# Patient Record
Sex: Male | Born: 1945 | Race: White | Hispanic: No | Marital: Married | Smoking: Never smoker
Health system: Southern US, Community
[De-identification: ages and names within clinical notes are randomized; demographics above are authoritative.]

## PROBLEM LIST (undated history)

## (undated) DIAGNOSIS — T7840XA Allergy, unspecified, initial encounter: Secondary | ICD-10-CM

## (undated) DIAGNOSIS — M329 Systemic lupus erythematosus, unspecified: Secondary | ICD-10-CM

## (undated) DIAGNOSIS — M810 Age-related osteoporosis without current pathological fracture: Secondary | ICD-10-CM

## (undated) DIAGNOSIS — I10 Essential (primary) hypertension: Secondary | ICD-10-CM

## (undated) HISTORY — DX: Age-related osteoporosis without current pathological fracture: M81.0

## (undated) HISTORY — DX: Essential (primary) hypertension: I10

## (undated) HISTORY — DX: Allergy, unspecified, initial encounter: T78.40XA

## (undated) HISTORY — DX: Systemic lupus erythematosus, unspecified: M32.9

---

## 1983-07-30 HISTORY — PX: APPENDECTOMY: SHX54

## 2001-05-28 HISTORY — PX: KIDNEY STONE SURGERY: SHX686

## 2003-11-29 HISTORY — PX: ESOPHAGOGASTRODUODENOSCOPY ENDOSCOPY: SHX5814

## 2003-11-29 HISTORY — PX: COLONOSCOPY: SHX174

## 2006-02-26 HISTORY — PX: HEMORROIDECTOMY: SUR656

## 2010-10-28 HISTORY — PX: HERNIA REPAIR: SHX51

## 2010-11-05 ENCOUNTER — Ambulatory Visit (HOSPITAL_COMMUNITY)
Admission: RE | Admit: 2010-11-05 | Discharge: 2010-11-05 | Payer: Self-pay | Source: Home / Self Care | Attending: General Surgery | Admitting: General Surgery

## 2011-02-08 LAB — DIFFERENTIAL
Basophils Relative: 1 % (ref 0–1)
Eosinophils Absolute: 0 10*3/uL (ref 0.0–0.7)
Eosinophils Relative: 1 % (ref 0–5)
Lymphs Abs: 1.2 10*3/uL (ref 0.7–4.0)
Monocytes Relative: 11 % (ref 3–12)
Neutrophils Relative %: 49 % (ref 43–77)

## 2011-02-08 LAB — COMPREHENSIVE METABOLIC PANEL
ALT: 17 U/L (ref 0–53)
AST: 27 U/L (ref 0–37)
Alkaline Phosphatase: 63 U/L (ref 39–117)
CO2: 30 mEq/L (ref 19–32)
Calcium: 9.2 mg/dL (ref 8.4–10.5)
GFR calc Af Amer: >60 mL/min (ref >60)
GFR calc non Af Amer: >60 mL/min (ref >60)
Glucose, Bld: 78 mg/dL (ref 70–99)
Potassium: 3.9 mEq/L (ref 3.5–5.1)
Sodium: 141 mEq/L (ref 135–145)

## 2011-02-08 LAB — CBC
HCT: 39.8 % (ref 39.0–52.0)
Hemoglobin: 13.6 g/dL (ref 13.0–17.0)
RBC: 4.15 MIL/uL — ABNORMAL LOW (ref 4.22–5.81)
WBC: 3 10*3/uL — ABNORMAL LOW (ref 4.0–10.5)

## 2011-02-08 LAB — URINALYSIS, ROUTINE W REFLEX MICROSCOPIC
Glucose, UA: NEGATIVE mg/dL
Nitrite: NEGATIVE
Specific Gravity, Urine: 1.014 (ref 1.005–1.030)
pH: 7.5 (ref 5.0–8.0)

## 2011-02-08 LAB — SURGICAL PCR SCREEN: MRSA, PCR: NEGATIVE

## 2011-10-04 DIAGNOSIS — I10 Essential (primary) hypertension: Secondary | ICD-10-CM | POA: Insufficient documentation

## 2011-12-01 IMAGING — CR DG CHEST 2V
3 series · 3 of 3 positions shown · non-contrast
Comparison: None.

CLINICAL DATA: Preop.

CHEST - 2 VIEW

[w chest pa]
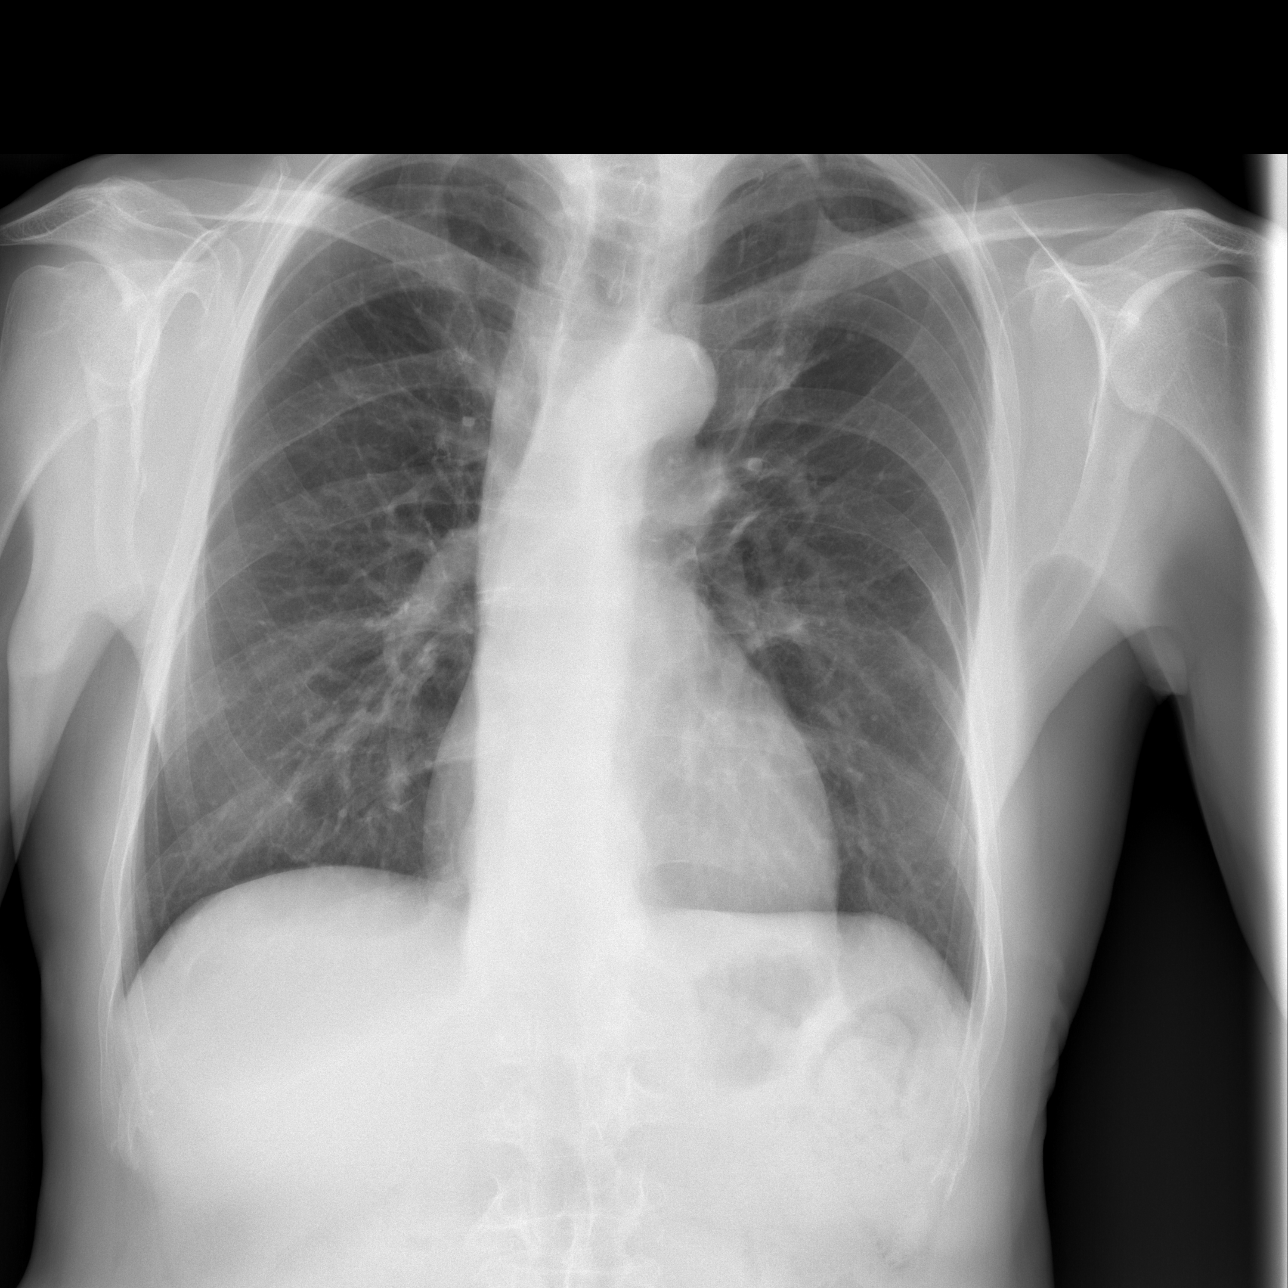

[w chest lat (1 of 2)]
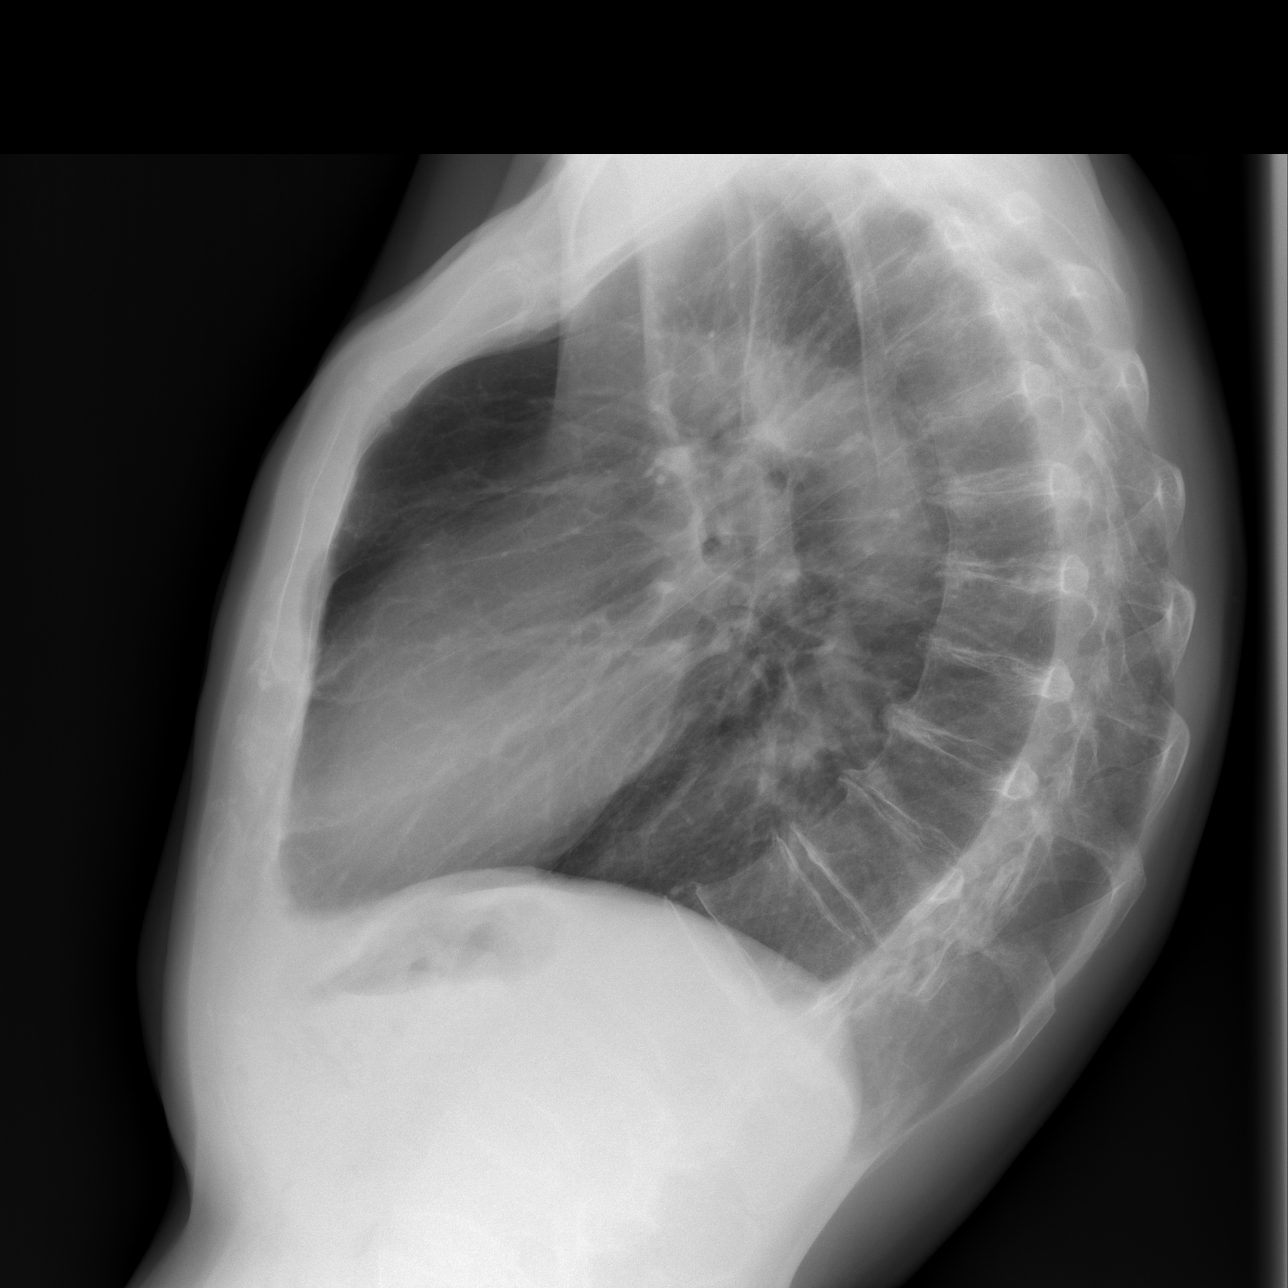

[w chest lat (2 of 2)]
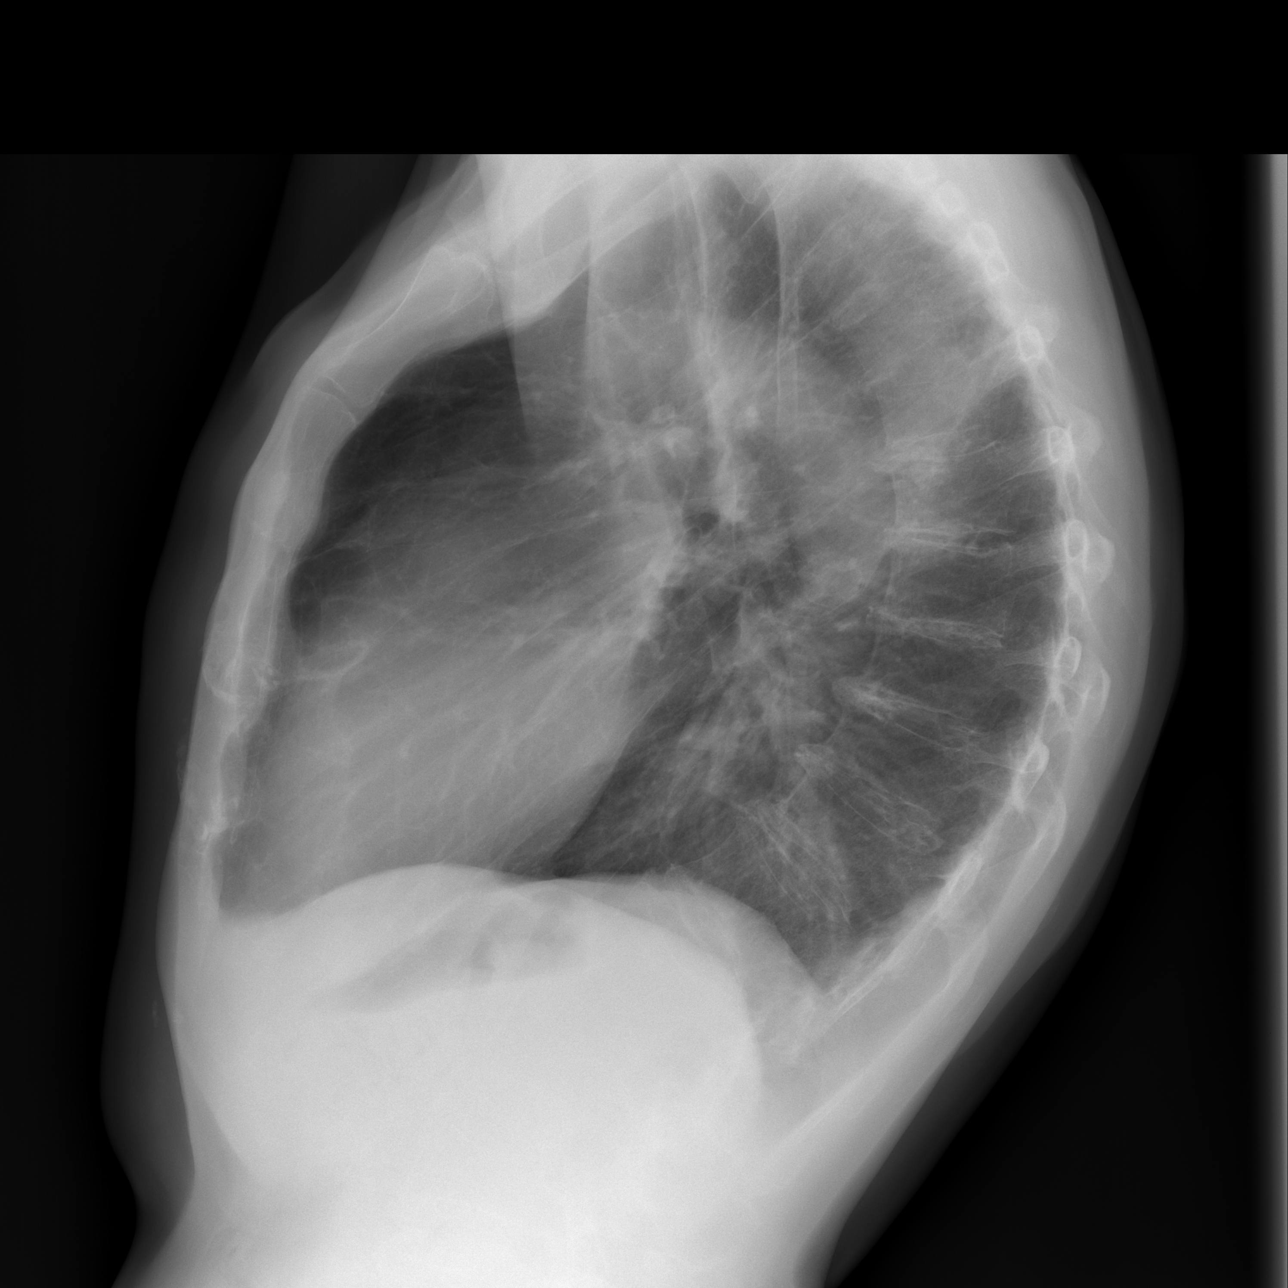

[3 of 3 positions shown; findings below may reference images not displayed]

FINDINGS: Trachea is midline.  Heart size normal.  Mild biapical
pleural parenchymal scarring.  Suspect mild left hilar retraction
with adjacent left suprahilar scarring.  Lungs  are otherwise
clear.  No pleural fluid.  Degenerative changes are seen in the
spine.
IMPRESSION: No acute findings.

## 2014-07-01 ENCOUNTER — Telehealth: Payer: Self-pay | Admitting: Gastroenterology

## 2014-07-04 ENCOUNTER — Encounter: Payer: Self-pay | Admitting: Gastroenterology

## 2014-08-25 ENCOUNTER — Ambulatory Visit (AMBULATORY_SURGERY_CENTER): Payer: Medicare Other | Admitting: *Deleted

## 2014-08-25 VITALS — Ht 70.0 in | Wt 149.8 lb

## 2014-08-25 DIAGNOSIS — Z1211 Encounter for screening for malignant neoplasm of colon: Secondary | ICD-10-CM

## 2014-08-25 MED ORDER — MOVIPREP 100 G PO SOLR: 1.0000 | Freq: Once | ORAL | Status: DC

## 2014-08-25 NOTE — Progress Notes (Signed)
No home 02 use. ewm No egg or soy allergy. ewm No diet pills ewm Pt declined emmi video. ewm Pt denies any issues with previous sedation. ewm Pt has been dx'd with lupus and has lighter patches on his back he wanted [place] to be aware of. ewm

## 2014-08-26 NOTE — Telephone Encounter (Signed)
Scheduled 09-09-14/yf

## 2014-09-09 ENCOUNTER — Encounter: Payer: Self-pay | Admitting: Gastroenterology

## 2014-11-11 ENCOUNTER — Encounter: Payer: Self-pay | Admitting: Gastroenterology

## 2014-11-11 ENCOUNTER — Ambulatory Visit (AMBULATORY_SURGERY_CENTER): Payer: Medicare Other | Admitting: Gastroenterology

## 2014-11-11 VITALS — BP 117/60 | HR 150 | Temp 96.7°F | Resp 34 | Ht 70.0 in | Wt 149.0 lb

## 2014-11-11 DIAGNOSIS — D124 Benign neoplasm of descending colon: Secondary | ICD-10-CM

## 2014-11-11 DIAGNOSIS — Z1211 Encounter for screening for malignant neoplasm of colon: Secondary | ICD-10-CM

## 2014-11-11 MED ORDER — SODIUM CHLORIDE 0.9 % IV SOLN: 500.0000 mL | INTRAVENOUS | Status: DC

## 2014-11-11 NOTE — Op Note (Signed)
[place]  [address] [place], [id number]   COLONOSCOPY PROCEDURE REPORT  PATIENT: Brian, Curtis  MR#: [id number] BIRTHDATE: 07/29/1946 , 72  yrs. old GENDER: male ENDOSCOPIST: Ladene Artist, MD, [facility] REFERRED BY: Azzie Roup, MD PROCEDURE DATE:  11/11/2014 PROCEDURE:   Colonoscopy with snare polypectomy First Screening Colonoscopy - Avg.  risk and is 50 yrs.  old or older - No.  Prior Negative Screening - Now for repeat screening. 10 or more years since last screening  History of Adenoma - Now for follow-up colonoscopy & has been > or = to 3 yrs.  N/A  Polyps Removed Today? Yes. ASA CLASS:   Class II INDICATIONS:average risk for colorectal cancer. MEDICATIONS: Monitored anesthesia care and Propofol 200 mg IV DESCRIPTION OF PROCEDURE:   After the risks benefits and alternatives of the procedure were thoroughly explained, informed consent was obtained.  The digital rectal exam revealed no abnormalities of the rectum.   The LB [id number]  endoscope was introduced through the anus and advanced to the cecum, which was identified by both the appendix and ileocecal valve. No adverse events experienced.   The quality of the prep was adequate, using MoviPrep  The instrument was then slowly withdrawn as the colon was fully examined.  COLON FINDINGS: A sessile polyp measuring 5 mm in size was found in the descending colon.  A polypectomy was performed with a cold snare.  The resection was complete, the polyp tissue was completely retrieved and sent to histology.   There was moderate diverticulosis noted in the left colon with associated luminal narrowing and muscular hypertrophy.   The colonic mucosa appeared normal in the transverse colon, at the splenic flexure, in the rectum, at the cecum, in the ascending colon, and at the hepatic flexure.  Retroflexed views revealed no abnormalities. The time to cecum=3 minutes 06 seconds.  Withdrawal time=8 minutes 50  seconds. The scope was withdrawn and the procedure completed.  COMPLICATIONS: There were no immediate complications.  ENDOSCOPIC IMPRESSION: 1.   Sessile polyp in the descending colon; polypectomy performed with a cold snare 2.   Moderate diverticulosis in the left colon  RECOMMENDATIONS: 1.  Await pathology results 2.  Repeat colonoscopy in 5 years if polyp adenomatous; otherwise 10 years 3.  High fiber diet with liberal fluid intake.  eSigned:  Ladene Artist, MD, [facility] 11/11/2014 11:38 AM

## 2014-11-11 NOTE — Progress Notes (Signed)
  [facility] Post-op Note  Patient: Brian Curtis  Procedure(s) Performed: colonoscopy  Patient Location: [facility] - Recovery Area  Anesthesia Type: Deep Sedation/Propofol  Level of Consciousness: awake, oriented and patient cooperative  Airway and Oxygen Therapy: Patient Spontanous Breathing  Post-op Pain: none  Post-op Assessment:  Post-op Vital signs reviewed, Patient's Cardiovascular Status Stable, Respiratory Function Stable, Patent Airway, No signs of Nausea or vomiting and Pain level controlled  Post-op Vital Signs: Reviewed and stable  Complications: No apparent anesthesia complications  Demetreus Lothamer E 11:40 AM

## 2014-11-11 NOTE — Progress Notes (Signed)
Called to room to assist during endoscopic procedure.  Patient ID and intended procedure confirmed with present staff. Received instructions for my participation in the procedure from the performing physician.  

## 2014-11-11 NOTE — Patient Instructions (Signed)

## 2014-11-12 ENCOUNTER — Telehealth: Payer: Self-pay

## 2014-11-12 NOTE — Telephone Encounter (Signed)
  Follow up Call-  Call back number 11/11/2014  Post procedure Call Back phone  # [phone]  Permission to leave phone message Yes     Patient questions:  Do you have a fever, pain , or abdominal swelling? No. Pain Score  0 *  Have you tolerated food without any problems? Yes.    Have you been able to return to your normal activities? Yes.    Do you have any questions about your discharge instructions: Diet   No. Medications  No. Follow up visit  No.  Do you have questions or concerns about your Care? No.  Actions: * If pain score is 4 or above: No action needed, pain <4.

## 2014-11-21 ENCOUNTER — Encounter: Payer: Self-pay | Admitting: Gastroenterology

## 2015-07-31 DIAGNOSIS — J309 Allergic rhinitis, unspecified: Secondary | ICD-10-CM

## 2015-08-27 ENCOUNTER — Ambulatory Visit (INDEPENDENT_AMBULATORY_CARE_PROVIDER_SITE_OTHER): Payer: Medicare Other | Admitting: *Deleted

## 2015-08-27 DIAGNOSIS — J309 Allergic rhinitis, unspecified: Secondary | ICD-10-CM

## 2015-09-17 ENCOUNTER — Ambulatory Visit (INDEPENDENT_AMBULATORY_CARE_PROVIDER_SITE_OTHER): Payer: Medicare Other | Admitting: *Deleted

## 2015-09-17 DIAGNOSIS — J309 Allergic rhinitis, unspecified: Secondary | ICD-10-CM

## 2015-10-08 ENCOUNTER — Ambulatory Visit (INDEPENDENT_AMBULATORY_CARE_PROVIDER_SITE_OTHER): Payer: Medicare Other | Admitting: *Deleted

## 2015-10-08 DIAGNOSIS — J3089 Other allergic rhinitis: Secondary | ICD-10-CM

## 2015-10-08 DIAGNOSIS — J309 Allergic rhinitis, unspecified: Secondary | ICD-10-CM | POA: Diagnosis not present

## 2015-10-16 DIAGNOSIS — J3089 Other allergic rhinitis: Secondary | ICD-10-CM | POA: Diagnosis not present

## 2015-10-26 ENCOUNTER — Ambulatory Visit (INDEPENDENT_AMBULATORY_CARE_PROVIDER_SITE_OTHER): Payer: Medicare Other

## 2015-10-26 DIAGNOSIS — J309 Allergic rhinitis, unspecified: Secondary | ICD-10-CM

## 2015-11-10 ENCOUNTER — Ambulatory Visit (INDEPENDENT_AMBULATORY_CARE_PROVIDER_SITE_OTHER): Payer: Medicare Other | Admitting: Pediatrics

## 2015-11-10 ENCOUNTER — Encounter: Payer: Self-pay | Admitting: Pediatrics

## 2015-11-10 VITALS — BP 126/66 | HR 56 | Temp 98.1°F | Resp 16 | Ht 68.11 in | Wt 151.0 lb

## 2015-11-10 DIAGNOSIS — J069 Acute upper respiratory infection, unspecified: Secondary | ICD-10-CM | POA: Insufficient documentation

## 2015-11-10 DIAGNOSIS — I10 Essential (primary) hypertension: Secondary | ICD-10-CM | POA: Insufficient documentation

## 2015-11-10 DIAGNOSIS — J3089 Other allergic rhinitis: Secondary | ICD-10-CM | POA: Diagnosis not present

## 2015-11-10 NOTE — Patient Instructions (Signed)
Continue on the treatment plan outlined above Nasal saline irrigations twice a day for about a week If he develops a sinus infection he will use amoxicillin 500 mg 3 times a day for 10 days He will not get another allergy injection until next week

## 2015-11-10 NOTE — Progress Notes (Signed)
  [place] [address] Dept: [phone]  FOLLOW UP NOTE  Patient ID: Brian Curtis, male    DOB: 08/09/1946  Age: 69 y.o. MRN: [id number] Date of Office Visit: 11/10/2015  Assessment Chief Complaint: Nasal Congestion  HPI Brian Curtis presents for evaluation of nasal congestion for 3 days. He has had a slight sore throat. He is on allergy injections every 3 weeks.  Current medications are lisinopril 10 mg once a day. His other medications are outlined in the chart   Drug Allergies:  No Known Allergies  Physical Exam: BP 126/66 mmHg  Pulse 56  Temp(Src) 98.1 F (36.7 C) (Oral)  Resp 16  Ht 5' 8.11" (1.73 m)  Wt 151 lb 0.2 oz (68.5 kg)  BMI 22.89 kg/m2   Physical Exam  Constitutional: He is oriented to person, place, and time. He appears well-developed and well-nourished.  HENT:  Eyes normal. Ears normal. Nose mild swelling of nasal turbinates. Pharynx normal.  Neck: Neck supple.  Cardiovascular:  S1 and S2 normal no murmurs  Pulmonary/Chest:  Clear to percussion and auscultation  Lymphadenopathy:    He has no cervical adenopathy.  Neurological: He is alert and oriented to person, place, and time.  Psychiatric: He has a normal mood and affect. His behavior is normal. Judgment and thought content normal.  Vitals reviewed.   Diagnostics:  none  Assessment and Plan: 1. Other allergic rhinitis   2. Viral upper respiratory tract infection   3. Essential hypertension         Patient Instructions  Continue on the treatment plan outlined above Nasal saline irrigations twice a day for about a week If he develops a sinus infection he will use amoxicillin 500 mg 3 times a day for 10 days He will not get another allergy injection until next week    No Follow-up on file.    Thank you for the opportunity to care for this patient.  Please do not hesitate to contact me with questions.  Penne Lash, M.D.  [facility] [address] [place], [place] [address] [phone]

## 2015-11-19 ENCOUNTER — Ambulatory Visit (INDEPENDENT_AMBULATORY_CARE_PROVIDER_SITE_OTHER): Payer: Medicare Other | Admitting: *Deleted

## 2015-11-19 DIAGNOSIS — J309 Allergic rhinitis, unspecified: Secondary | ICD-10-CM | POA: Diagnosis not present

## 2015-11-25 ENCOUNTER — Ambulatory Visit (INDEPENDENT_AMBULATORY_CARE_PROVIDER_SITE_OTHER): Payer: Medicare Other

## 2015-11-25 DIAGNOSIS — J309 Allergic rhinitis, unspecified: Secondary | ICD-10-CM | POA: Diagnosis not present

## 2015-12-02 ENCOUNTER — Ambulatory Visit (INDEPENDENT_AMBULATORY_CARE_PROVIDER_SITE_OTHER): Payer: Medicare Other

## 2015-12-02 DIAGNOSIS — J309 Allergic rhinitis, unspecified: Secondary | ICD-10-CM

## 2015-12-10 ENCOUNTER — Ambulatory Visit (INDEPENDENT_AMBULATORY_CARE_PROVIDER_SITE_OTHER): Payer: Medicare Other | Admitting: *Deleted

## 2015-12-10 DIAGNOSIS — J309 Allergic rhinitis, unspecified: Secondary | ICD-10-CM

## 2015-12-17 ENCOUNTER — Ambulatory Visit (INDEPENDENT_AMBULATORY_CARE_PROVIDER_SITE_OTHER): Payer: Medicare Other | Admitting: *Deleted

## 2015-12-17 ENCOUNTER — Encounter: Payer: Self-pay | Admitting: Pediatrics

## 2015-12-17 ENCOUNTER — Ambulatory Visit (INDEPENDENT_AMBULATORY_CARE_PROVIDER_SITE_OTHER): Payer: Medicare Other | Admitting: Pediatrics

## 2015-12-17 VITALS — BP 130/68 | HR 76 | Temp 97.6°F | Resp 16 | Ht 68.3 in | Wt 150.4 lb

## 2015-12-17 DIAGNOSIS — K5790 Diverticulosis of intestine, part unspecified, without perforation or abscess without bleeding: Secondary | ICD-10-CM | POA: Insufficient documentation

## 2015-12-17 DIAGNOSIS — H1045 Other chronic allergic conjunctivitis: Secondary | ICD-10-CM | POA: Diagnosis not present

## 2015-12-17 DIAGNOSIS — I10 Essential (primary) hypertension: Secondary | ICD-10-CM

## 2015-12-17 DIAGNOSIS — H1010 Acute atopic conjunctivitis, unspecified eye: Secondary | ICD-10-CM

## 2015-12-17 DIAGNOSIS — J309 Allergic rhinitis, unspecified: Secondary | ICD-10-CM

## 2015-12-17 DIAGNOSIS — N200 Calculus of kidney: Secondary | ICD-10-CM | POA: Insufficient documentation

## 2015-12-17 NOTE — Progress Notes (Signed)
  [place] [address] Dept: [phone]  FOLLOW UP NOTE  Patient ID: Brian Curtis, male    DOB: [birth year]  Age: 70 y.o. MRN: [id number] Date of Office Visit: 12/17/2015  Assessment Chief Complaint: Follow-up  HPI DEVANTA JUBY presents for follow-up of allergic rhinitis. He is symptoms are dramatically improved taking allergy injections every 3 weeks. He would like to continue on allergy injections every 3 weeks because it keeps him from having sinus infections.  Current medications are allergy injections. His other medications are outlined in the chart    Drug Allergies:  No Known Allergies  Physical Exam: BP 130/68 mmHg  Pulse 76  Temp(Src) 97.6 F (36.4 C) (Oral)  Resp 16  Ht 5' 8.3" (1.735 m)  Wt 150 lb 6.4 oz (68.221 kg)  BMI 22.66 kg/m2   Physical Exam  Constitutional: He is oriented to person, place, and time. He appears well-developed and well-nourished.  HENT:  Eyes normal. Ears normal. Nose normal. Pharynx normal.  Neck: Neck supple.  Cardiovascular:  S1 and S2 normal no murmurs  Pulmonary/Chest:  Clear to percussion auscultation  Lymphadenopathy:    He has no cervical adenopathy.  Neurological: He is alert and oriented to person, place, and time.  Psychiatric: He has a normal mood and affect. His behavior is normal. Judgment and thought content normal.  Vitals reviewed.   Diagnostics:   none Assessment and Plan: 1. Essential hypertension   2. Seasonal allergic conjunctivitis         Patient Instructions  Continue on the treatment plan outlined above with allergy injections every 3 weeks Loratadine 10 mg once a day if needed for runny nose or itchy eyes    Return in about 1 year (around 12/16/2016).    Thank you for the opportunity to care for this patient.  Please do not hesitate to contact me with questions.  Penne Lash, M.D.  [facility] [address] [place], [place]  [address] [phone]

## 2015-12-17 NOTE — Patient Instructions (Addendum)
Continue on the treatment plan outlined above with allergy injections every 3 weeks Loratadine 10 mg once a day if needed for runny nose or itchy eyes

## 2016-01-05 ENCOUNTER — Ambulatory Visit (INDEPENDENT_AMBULATORY_CARE_PROVIDER_SITE_OTHER): Payer: Medicare Other

## 2016-01-05 DIAGNOSIS — J309 Allergic rhinitis, unspecified: Secondary | ICD-10-CM

## 2016-01-21 DIAGNOSIS — J3089 Other allergic rhinitis: Secondary | ICD-10-CM

## 2016-01-25 ENCOUNTER — Ambulatory Visit (INDEPENDENT_AMBULATORY_CARE_PROVIDER_SITE_OTHER): Payer: Medicare Other

## 2016-01-25 DIAGNOSIS — J309 Allergic rhinitis, unspecified: Secondary | ICD-10-CM | POA: Diagnosis not present

## 2016-02-16 ENCOUNTER — Ambulatory Visit (INDEPENDENT_AMBULATORY_CARE_PROVIDER_SITE_OTHER): Payer: Medicare Other

## 2016-02-16 DIAGNOSIS — J309 Allergic rhinitis, unspecified: Secondary | ICD-10-CM

## 2016-03-07 ENCOUNTER — Ambulatory Visit (INDEPENDENT_AMBULATORY_CARE_PROVIDER_SITE_OTHER): Payer: Medicare Other

## 2016-03-07 DIAGNOSIS — J309 Allergic rhinitis, unspecified: Secondary | ICD-10-CM | POA: Diagnosis not present

## 2016-03-30 ENCOUNTER — Ambulatory Visit (INDEPENDENT_AMBULATORY_CARE_PROVIDER_SITE_OTHER): Payer: Medicare Other | Admitting: *Deleted

## 2016-03-30 DIAGNOSIS — J309 Allergic rhinitis, unspecified: Secondary | ICD-10-CM

## 2016-04-11 ENCOUNTER — Ambulatory Visit (INDEPENDENT_AMBULATORY_CARE_PROVIDER_SITE_OTHER): Payer: Medicare Other

## 2016-04-11 DIAGNOSIS — J309 Allergic rhinitis, unspecified: Secondary | ICD-10-CM | POA: Diagnosis not present

## 2016-05-04 ENCOUNTER — Ambulatory Visit (INDEPENDENT_AMBULATORY_CARE_PROVIDER_SITE_OTHER): Payer: Medicare Other

## 2016-05-04 DIAGNOSIS — J309 Allergic rhinitis, unspecified: Secondary | ICD-10-CM

## 2016-05-10 ENCOUNTER — Ambulatory Visit (INDEPENDENT_AMBULATORY_CARE_PROVIDER_SITE_OTHER): Payer: Medicare Other | Admitting: *Deleted

## 2016-05-10 DIAGNOSIS — J309 Allergic rhinitis, unspecified: Secondary | ICD-10-CM

## 2016-05-16 ENCOUNTER — Ambulatory Visit (INDEPENDENT_AMBULATORY_CARE_PROVIDER_SITE_OTHER): Payer: Medicare Other

## 2016-05-16 DIAGNOSIS — J309 Allergic rhinitis, unspecified: Secondary | ICD-10-CM | POA: Diagnosis not present

## 2016-05-23 ENCOUNTER — Ambulatory Visit (INDEPENDENT_AMBULATORY_CARE_PROVIDER_SITE_OTHER): Payer: Medicare Other

## 2016-05-23 DIAGNOSIS — J309 Allergic rhinitis, unspecified: Secondary | ICD-10-CM

## 2016-06-01 ENCOUNTER — Ambulatory Visit (INDEPENDENT_AMBULATORY_CARE_PROVIDER_SITE_OTHER): Payer: Medicare Other

## 2016-06-01 DIAGNOSIS — J309 Allergic rhinitis, unspecified: Secondary | ICD-10-CM | POA: Diagnosis not present

## 2016-06-20 ENCOUNTER — Ambulatory Visit (INDEPENDENT_AMBULATORY_CARE_PROVIDER_SITE_OTHER): Payer: Medicare Other

## 2016-06-20 DIAGNOSIS — J309 Allergic rhinitis, unspecified: Secondary | ICD-10-CM

## 2016-07-07 ENCOUNTER — Ambulatory Visit (INDEPENDENT_AMBULATORY_CARE_PROVIDER_SITE_OTHER): Payer: Medicare Other

## 2016-07-07 DIAGNOSIS — J309 Allergic rhinitis, unspecified: Secondary | ICD-10-CM | POA: Diagnosis not present

## 2016-07-28 ENCOUNTER — Ambulatory Visit (INDEPENDENT_AMBULATORY_CARE_PROVIDER_SITE_OTHER): Payer: Medicare Other

## 2016-07-28 DIAGNOSIS — J309 Allergic rhinitis, unspecified: Secondary | ICD-10-CM

## 2016-07-29 ENCOUNTER — Encounter: Payer: Self-pay | Admitting: *Deleted

## 2016-07-29 DIAGNOSIS — J3089 Other allergic rhinitis: Secondary | ICD-10-CM | POA: Diagnosis not present

## 2016-08-16 ENCOUNTER — Ambulatory Visit (INDEPENDENT_AMBULATORY_CARE_PROVIDER_SITE_OTHER): Payer: Medicare Other

## 2016-08-16 DIAGNOSIS — J309 Allergic rhinitis, unspecified: Secondary | ICD-10-CM | POA: Diagnosis not present

## 2016-09-05 ENCOUNTER — Ambulatory Visit (INDEPENDENT_AMBULATORY_CARE_PROVIDER_SITE_OTHER): Payer: Medicare Other

## 2016-09-05 DIAGNOSIS — J309 Allergic rhinitis, unspecified: Secondary | ICD-10-CM | POA: Diagnosis not present

## 2016-09-27 ENCOUNTER — Ambulatory Visit (INDEPENDENT_AMBULATORY_CARE_PROVIDER_SITE_OTHER): Payer: Medicare Other

## 2016-09-27 DIAGNOSIS — J309 Allergic rhinitis, unspecified: Secondary | ICD-10-CM

## 2016-10-11 ENCOUNTER — Ambulatory Visit (INDEPENDENT_AMBULATORY_CARE_PROVIDER_SITE_OTHER): Payer: Medicare Other

## 2016-10-11 DIAGNOSIS — J309 Allergic rhinitis, unspecified: Secondary | ICD-10-CM

## 2016-10-25 ENCOUNTER — Ambulatory Visit (INDEPENDENT_AMBULATORY_CARE_PROVIDER_SITE_OTHER): Payer: Medicare Other

## 2016-10-25 DIAGNOSIS — J309 Allergic rhinitis, unspecified: Secondary | ICD-10-CM | POA: Diagnosis not present

## 2016-11-07 ENCOUNTER — Ambulatory Visit (INDEPENDENT_AMBULATORY_CARE_PROVIDER_SITE_OTHER): Payer: Medicare Other

## 2016-11-07 DIAGNOSIS — J309 Allergic rhinitis, unspecified: Secondary | ICD-10-CM

## 2016-11-17 ENCOUNTER — Ambulatory Visit (INDEPENDENT_AMBULATORY_CARE_PROVIDER_SITE_OTHER): Payer: Medicare Other

## 2016-11-17 DIAGNOSIS — J309 Allergic rhinitis, unspecified: Secondary | ICD-10-CM

## 2016-12-06 ENCOUNTER — Ambulatory Visit (INDEPENDENT_AMBULATORY_CARE_PROVIDER_SITE_OTHER): Payer: Medicare Other

## 2016-12-06 DIAGNOSIS — J309 Allergic rhinitis, unspecified: Secondary | ICD-10-CM | POA: Diagnosis not present

## 2016-12-20 NOTE — Addendum Note (Signed)
Addended by: Felipa Emory on: 12/20/2016 03:10 PM   Modules accepted: Orders

## 2016-12-28 ENCOUNTER — Ambulatory Visit (INDEPENDENT_AMBULATORY_CARE_PROVIDER_SITE_OTHER): Payer: Medicare Other | Admitting: *Deleted

## 2016-12-28 DIAGNOSIS — J309 Allergic rhinitis, unspecified: Secondary | ICD-10-CM | POA: Diagnosis not present

## 2016-12-30 ENCOUNTER — Encounter: Payer: Self-pay | Admitting: *Deleted

## 2016-12-30 NOTE — Progress Notes (Signed)
1 maintenance vial made. Exp: 12/30/17. hc

## 2017-01-03 ENCOUNTER — Ambulatory Visit (INDEPENDENT_AMBULATORY_CARE_PROVIDER_SITE_OTHER): Payer: Medicare Other | Admitting: Pediatrics

## 2017-01-03 ENCOUNTER — Encounter: Payer: Self-pay | Admitting: Pediatrics

## 2017-01-03 VITALS — BP 114/64 | HR 60 | Temp 97.5°F | Resp 16 | Ht 68.0 in | Wt 150.0 lb

## 2017-01-03 DIAGNOSIS — I10 Essential (primary) hypertension: Secondary | ICD-10-CM

## 2017-01-03 DIAGNOSIS — J301 Allergic rhinitis due to pollen: Secondary | ICD-10-CM | POA: Diagnosis not present

## 2017-01-03 NOTE — Patient Instructions (Addendum)
Continue on allergy injections every 3 weeks Call me if he is not doing well on this treatment plan Loratadine 10 mg once a day if needed for runny nose

## 2017-01-03 NOTE — Progress Notes (Signed)
  [place] [address] Dept: [phone]  FOLLOW UP NOTE  Patient ID: Brian Curtis, male    DOB: 1946-07-25  Age: 71 y.o. MRN: [id number] Date of Office Visit: 01/03/2017  Assessment  Chief Complaint: Allergies  HPI Brian Curtis presents for follow-up of allergic rhinitis. His allergic rhinitis is well controlled on getting his allergy injections every 3 weeks. He would like to continue on allergy injections. He does nasal saline irrigations once a day and that is all he needs for his allergic rhinitis, in addition to the injections He has not had a sinus infection in the past year.  Current medications are outlined in the chart   Drug Allergies:  No Known Allergies  Physical Exam: BP 114/64   Pulse 60   Temp 97.5 F (36.4 C) (Oral)   Resp 16   Ht 5\' 8"  (1.727 m)   Wt 150 lb (68 kg)   BMI 22.81 kg/m    Physical Exam  Constitutional: He is oriented to person, place, and time. He appears well-developed and well-nourished.  HENT:  Eyes normal. Ears normal. Nose normal. Pharynx normal.  Neck: Neck supple.  Cardiovascular:  S1  S2 normal no murmurs  Pulmonary/Chest:  Clear to percussion and auscultation  Lymphadenopathy:    He has no cervical adenopathy.  Neurological: He is alert and oriented to person, place, and time.  Psychiatric: He has a normal mood and affect. His behavior is normal. Judgment and thought content normal.  Vitals reviewed.   Diagnostics:  none  Assessment and Plan: 1. Acute seasonal allergic rhinitis due to pollen   2. Essential hypertension      Patient Instructions  Continue on allergy injections every 3 weeks Call me if he is not doing well on this treatment plan Loratadine 10 mg once a day if needed for runny nose   Return in about 1 year (around 01/03/2018).    Thank you for the opportunity to care for this patient.  Please do not hesitate to contact me with questions.  Penne Lash, M.D.  [facility] [address] [place], [place] [address] [phone]

## 2017-01-17 ENCOUNTER — Ambulatory Visit (INDEPENDENT_AMBULATORY_CARE_PROVIDER_SITE_OTHER): Payer: Medicare Other

## 2017-01-17 DIAGNOSIS — J309 Allergic rhinitis, unspecified: Secondary | ICD-10-CM

## 2017-01-24 DIAGNOSIS — J3089 Other allergic rhinitis: Secondary | ICD-10-CM

## 2017-02-08 ENCOUNTER — Ambulatory Visit (INDEPENDENT_AMBULATORY_CARE_PROVIDER_SITE_OTHER): Payer: Medicare Other

## 2017-02-08 DIAGNOSIS — J309 Allergic rhinitis, unspecified: Secondary | ICD-10-CM

## 2017-03-02 ENCOUNTER — Ambulatory Visit (INDEPENDENT_AMBULATORY_CARE_PROVIDER_SITE_OTHER): Payer: Medicare Other

## 2017-03-02 DIAGNOSIS — J309 Allergic rhinitis, unspecified: Secondary | ICD-10-CM | POA: Diagnosis not present

## 2017-03-23 ENCOUNTER — Ambulatory Visit (INDEPENDENT_AMBULATORY_CARE_PROVIDER_SITE_OTHER): Payer: Medicare Other | Admitting: *Deleted

## 2017-03-23 DIAGNOSIS — J309 Allergic rhinitis, unspecified: Secondary | ICD-10-CM

## 2017-03-30 ENCOUNTER — Ambulatory Visit (INDEPENDENT_AMBULATORY_CARE_PROVIDER_SITE_OTHER): Payer: Medicare Other

## 2017-03-30 DIAGNOSIS — J309 Allergic rhinitis, unspecified: Secondary | ICD-10-CM | POA: Diagnosis not present

## 2017-04-06 ENCOUNTER — Ambulatory Visit (INDEPENDENT_AMBULATORY_CARE_PROVIDER_SITE_OTHER): Payer: Medicare Other

## 2017-04-06 DIAGNOSIS — J309 Allergic rhinitis, unspecified: Secondary | ICD-10-CM | POA: Diagnosis not present

## 2017-04-13 ENCOUNTER — Ambulatory Visit (INDEPENDENT_AMBULATORY_CARE_PROVIDER_SITE_OTHER): Payer: Medicare Other | Admitting: *Deleted

## 2017-04-13 DIAGNOSIS — J309 Allergic rhinitis, unspecified: Secondary | ICD-10-CM

## 2017-04-18 ENCOUNTER — Ambulatory Visit (INDEPENDENT_AMBULATORY_CARE_PROVIDER_SITE_OTHER): Payer: Medicare Other | Admitting: *Deleted

## 2017-04-18 DIAGNOSIS — J309 Allergic rhinitis, unspecified: Secondary | ICD-10-CM

## 2017-05-09 ENCOUNTER — Ambulatory Visit (INDEPENDENT_AMBULATORY_CARE_PROVIDER_SITE_OTHER): Payer: Medicare Other

## 2017-05-09 DIAGNOSIS — J309 Allergic rhinitis, unspecified: Secondary | ICD-10-CM

## 2017-05-25 ENCOUNTER — Ambulatory Visit (INDEPENDENT_AMBULATORY_CARE_PROVIDER_SITE_OTHER): Payer: Medicare Other | Admitting: *Deleted

## 2017-05-25 DIAGNOSIS — J309 Allergic rhinitis, unspecified: Secondary | ICD-10-CM | POA: Diagnosis not present

## 2017-06-02 DIAGNOSIS — J301 Allergic rhinitis due to pollen: Secondary | ICD-10-CM

## 2017-06-13 ENCOUNTER — Ambulatory Visit (INDEPENDENT_AMBULATORY_CARE_PROVIDER_SITE_OTHER): Payer: Medicare Other

## 2017-06-13 DIAGNOSIS — J309 Allergic rhinitis, unspecified: Secondary | ICD-10-CM | POA: Diagnosis not present

## 2017-06-28 ENCOUNTER — Ambulatory Visit (INDEPENDENT_AMBULATORY_CARE_PROVIDER_SITE_OTHER): Payer: Medicare Other | Admitting: *Deleted

## 2017-06-28 DIAGNOSIS — J309 Allergic rhinitis, unspecified: Secondary | ICD-10-CM

## 2017-07-04 ENCOUNTER — Ambulatory Visit (INDEPENDENT_AMBULATORY_CARE_PROVIDER_SITE_OTHER): Payer: Medicare Other | Admitting: Physician Assistant

## 2017-07-13 ENCOUNTER — Ambulatory Visit (INDEPENDENT_AMBULATORY_CARE_PROVIDER_SITE_OTHER): Payer: Medicare Other | Admitting: Physician Assistant

## 2017-07-20 ENCOUNTER — Ambulatory Visit (INDEPENDENT_AMBULATORY_CARE_PROVIDER_SITE_OTHER): Payer: Medicare Other

## 2017-07-20 DIAGNOSIS — J309 Allergic rhinitis, unspecified: Secondary | ICD-10-CM | POA: Diagnosis not present

## 2017-08-07 ENCOUNTER — Ambulatory Visit (INDEPENDENT_AMBULATORY_CARE_PROVIDER_SITE_OTHER): Payer: Medicare Other

## 2017-08-07 DIAGNOSIS — J309 Allergic rhinitis, unspecified: Secondary | ICD-10-CM

## 2017-08-10 ENCOUNTER — Ambulatory Visit (INDEPENDENT_AMBULATORY_CARE_PROVIDER_SITE_OTHER): Payer: Medicare Other | Admitting: Orthopaedic Surgery

## 2017-08-30 ENCOUNTER — Ambulatory Visit (INDEPENDENT_AMBULATORY_CARE_PROVIDER_SITE_OTHER): Payer: Medicare Other

## 2017-08-30 DIAGNOSIS — J309 Allergic rhinitis, unspecified: Secondary | ICD-10-CM

## 2017-09-04 ENCOUNTER — Ambulatory Visit (INDEPENDENT_AMBULATORY_CARE_PROVIDER_SITE_OTHER): Payer: Medicare Other

## 2017-09-04 ENCOUNTER — Ambulatory Visit (INDEPENDENT_AMBULATORY_CARE_PROVIDER_SITE_OTHER): Payer: Medicare Other | Admitting: Orthopaedic Surgery

## 2017-09-04 DIAGNOSIS — M25552 Pain in left hip: Secondary | ICD-10-CM

## 2017-09-04 DIAGNOSIS — J309 Allergic rhinitis, unspecified: Secondary | ICD-10-CM | POA: Diagnosis not present

## 2017-09-04 DIAGNOSIS — M25562 Pain in left knee: Secondary | ICD-10-CM

## 2017-09-04 DIAGNOSIS — G8929 Other chronic pain: Secondary | ICD-10-CM | POA: Diagnosis not present

## 2017-09-04 DIAGNOSIS — M1612 Unilateral primary osteoarthritis, left hip: Secondary | ICD-10-CM

## 2017-09-04 NOTE — Progress Notes (Signed)
Office Visit Note   Patient: Brian Curtis           Date of Birth: 1946/01/22           MRN: [id number] Visit Date: 09/04/2017              Requested by: Pollyann Samples, [place], [place] [address] PCP: Elby Beck, MD   Assessment & Plan: Visit Diagnoses:  1. Pain in left hip   2. Chronic pain of left knee   3. Unilateral primary osteoarthritis, left hip     Plan: I showed him the x-rays of his left hip and left knee. I feel that he would benefit from a total hip arthroplasty in the future when he gets the point where it is detrimentally affecting his activities daily living, his quality of life, and his mobility. I gave him a handout on hip replacement surgery and we discussed the risk and benefits of this and what this would involve in detail. He is not ready for my clinical standpoint terms of pain. His x-rays show that radiographically he is ready for a hip Replacement but until this definitely affects him clinically, he would just follow a clinical nonoperative course. I suggested anti-inflammatories and glucosamine as needed. All questions concerns were answered and addressed. He'll follow-up otherwise as needed.  Follow-Up Instructions: Return if symptoms worsen or fail to improve.   Orders:  Orders Placed This Encounter  Procedures  . XR HIP UNILAT W OR W/O PELVIS 1V LEFT  . XR Knee 1-2 Views Left   No orders of the defined types were placed in this encounter.     Procedures: No procedures performed   Clinical Data: No additional findings.   Subjective: No chief complaint on file. Patient is very pleasant 71 year old I'm seeing for the first time as a patient. I have performed a hip replacement on his wife before though. He does come in reporting left hip pain and left knee pain is been going on for some time now. He said is more of an annoyance and inconvenience than severe pain. He does like to know about [place] with  pushing and walking as well as riding and if he's been doing that all day long and he does get pain in that hip after rest. He denies any specific injuries. He does report some back pain.  HPI  Review of Systems He currently denies any headache, chest pain, shortness of breath, fever, chills, nausea, vomiting.  Objective: Vital Signs: There were no vitals taken for this visit.  Physical Exam He is alert and oriented 3 and in no acute distress Ortho Exam Examination of his left knee shows some slight patellofemoral crepitation and slight laterally tracking of the patella. There is no knee joint effusion. His knee is ligamentously stable. There is no significant joint line tenderness. Examination of his left hip show significant limitations in internal rotation rotation. I can actually feel a grinding of the femoral head on the acetabulum. This does cause pain in the groin with rotation. Specialty Comments:  No specialty comments available.  Imaging: Xr Hip Unilat W Or W/o Pelvis 1v Left  Result Date: 09/04/2017 An AP pelvis and lateral of his left hip show severe end-stage arthritis. There is flattening of the femoral head. There is significant sclerotic changes and joint space narrowing as well as para-articular osteophytes.  Xr Knee 1-2 Views Left  Result Date: 09/04/2017 2 views  the left knee show no acute findings. There is minimal lateral joint space narrowing. The overall I'm is well-maintained.    PMFS History: Patient Active Problem List   Diagnosis Date Noted  . Unilateral primary osteoarthritis, left hip 09/04/2017  . DD (diverticular disease) 12/17/2015  . Calculus of kidney 12/17/2015  . Seasonal allergic conjunctivitis 12/17/2015  . Viral upper respiratory tract infection 11/10/2015  . Other allergic rhinitis 11/10/2015  . Essential hypertension 11/10/2015  . Allergic rhinitis 07/31/2015  . BP (high blood pressure) 10/04/2011   Past Medical History:  Diagnosis  Date  . Allergy    allergy shots every 3 weeks  . Hypertension   . Lupus   . Osteoporosis    debateable per pt.     Family History  Problem Relation Age of Onset  . Colon cancer Father   . Stroke Father     Past Surgical History:  Procedure Laterality Date  . APPENDECTOMY  07/1983  . COLONOSCOPY  11/2003  . ESOPHAGOGASTRODUODENOSCOPY ENDOSCOPY  11/2003  . HEMORROIDECTOMY  02/2006   with blood clot  . HERNIA REPAIR  10/2010   "double" per pt.  Marland Kitchen KIDNEY STONE SURGERY  05/2001   Social History   Occupational History  . Not on file.   Social History Main Topics  . Smoking status: Never Smoker  . Smokeless tobacco: Never Used  . Alcohol use 0.6 oz/week    1 Glasses of wine per week     Comment: rare socail  . Drug use: No  . Sexual activity: Yes

## 2017-09-11 ENCOUNTER — Ambulatory Visit (INDEPENDENT_AMBULATORY_CARE_PROVIDER_SITE_OTHER): Payer: Medicare Other

## 2017-09-11 DIAGNOSIS — J309 Allergic rhinitis, unspecified: Secondary | ICD-10-CM | POA: Diagnosis not present

## 2017-09-27 ENCOUNTER — Ambulatory Visit (INDEPENDENT_AMBULATORY_CARE_PROVIDER_SITE_OTHER): Payer: Medicare Other

## 2017-09-27 DIAGNOSIS — J309 Allergic rhinitis, unspecified: Secondary | ICD-10-CM

## 2017-10-13 ENCOUNTER — Ambulatory Visit (INDEPENDENT_AMBULATORY_CARE_PROVIDER_SITE_OTHER): Payer: Medicare Other | Admitting: *Deleted

## 2017-10-13 DIAGNOSIS — J309 Allergic rhinitis, unspecified: Secondary | ICD-10-CM | POA: Diagnosis not present

## 2017-11-01 ENCOUNTER — Ambulatory Visit (INDEPENDENT_AMBULATORY_CARE_PROVIDER_SITE_OTHER): Payer: Medicare Other

## 2017-11-01 DIAGNOSIS — J309 Allergic rhinitis, unspecified: Secondary | ICD-10-CM | POA: Diagnosis not present

## 2017-11-15 ENCOUNTER — Ambulatory Visit (INDEPENDENT_AMBULATORY_CARE_PROVIDER_SITE_OTHER): Payer: Medicare Other | Admitting: *Deleted

## 2017-11-15 DIAGNOSIS — J309 Allergic rhinitis, unspecified: Secondary | ICD-10-CM

## 2017-12-04 ENCOUNTER — Ambulatory Visit (INDEPENDENT_AMBULATORY_CARE_PROVIDER_SITE_OTHER): Payer: Medicare Other

## 2017-12-04 DIAGNOSIS — J309 Allergic rhinitis, unspecified: Secondary | ICD-10-CM

## 2017-12-11 ENCOUNTER — Ambulatory Visit (INDEPENDENT_AMBULATORY_CARE_PROVIDER_SITE_OTHER): Payer: Medicare Other | Admitting: Orthopaedic Surgery

## 2017-12-12 NOTE — Progress Notes (Signed)
VIAL EXP 12-13-18

## 2017-12-13 DIAGNOSIS — J301 Allergic rhinitis due to pollen: Secondary | ICD-10-CM | POA: Diagnosis not present

## 2017-12-19 ENCOUNTER — Encounter (INDEPENDENT_AMBULATORY_CARE_PROVIDER_SITE_OTHER): Payer: Self-pay | Admitting: Orthopaedic Surgery

## 2017-12-19 ENCOUNTER — Ambulatory Visit (INDEPENDENT_AMBULATORY_CARE_PROVIDER_SITE_OTHER): Payer: Medicare Other | Admitting: Orthopaedic Surgery

## 2017-12-19 DIAGNOSIS — M1612 Unilateral primary osteoarthritis, left hip: Secondary | ICD-10-CM | POA: Diagnosis not present

## 2017-12-19 NOTE — Progress Notes (Signed)
Brian Curtis is a 72 year old male known to Dr. Ninfa Linden service comes in today with questions prior to his left total hip arthroplasty 01/05/2018.  He states he just has some questions of IV answered prior to surgery.  He states his pain has become worse in his left hip since he was seen here approximately 3 months ago.  He has had no new injury to the hip.  Remains on lisinopril for hypertension.  No fevers chills shortness of breath chest pain.  Review of systems: See HPI otherwise negative  Physical exam: General well-developed well-nourished male in no acute distress.  He is able to ambulate and get on and off the exam table on his own.  He does not use any assistive devices to ambulate.  Impression: Severe left hip end-stage arthritis  Plan: Questions were answered at length today by Dr. Ninfa Linden myself.  We will have him follow-up with [place] 2 weeks postop.

## 2017-12-20 ENCOUNTER — Other Ambulatory Visit (INDEPENDENT_AMBULATORY_CARE_PROVIDER_SITE_OTHER): Payer: Self-pay

## 2017-12-25 ENCOUNTER — Ambulatory Visit (INDEPENDENT_AMBULATORY_CARE_PROVIDER_SITE_OTHER): Payer: Medicare Other

## 2017-12-25 ENCOUNTER — Other Ambulatory Visit (INDEPENDENT_AMBULATORY_CARE_PROVIDER_SITE_OTHER): Payer: Self-pay | Admitting: Physician Assistant

## 2017-12-25 DIAGNOSIS — J309 Allergic rhinitis, unspecified: Secondary | ICD-10-CM

## 2017-12-28 ENCOUNTER — Other Ambulatory Visit (HOSPITAL_COMMUNITY): Payer: Self-pay | Admitting: Emergency Medicine

## 2017-12-28 NOTE — Patient Instructions (Signed)
Brian Curtis  12/28/2017   Your procedure is scheduled on: 01-05-18   Report to [facility] Main  Entrance    Report to admitting at 8:15AM   Call this number if you have problems the morning of surgery [phone]     Remember: Do not eat food or drink liquids :After Midnight.     Take these medicines the morning of surgery with A SIP OF WATER: none                                You may not have any metal on your body including hair pins and              piercings  Do not wear jewelry, make-up, lotions, powders or perfumes, deodorant                   Men may shave face and neck.   Do not bring valuables to the hospital. [place].  Contacts, dentures or bridgework may not be worn into surgery.  Leave suitcase in the car. After surgery it may be brought to your room.                 Please read over the following fact sheets you were given: _____________________________________________________________________             [facility] - Preparing for Surgery Before surgery, you can play an important role.  Because skin is not sterile, your skin needs to be as free of germs as possible.  You can reduce the number of germs on your skin by washing with CHG (chlorahexidine gluconate) soap before surgery.  CHG is an antiseptic cleaner which kills germs and bonds with the skin to continue killing germs even after washing. Please DO NOT use if you have an allergy to CHG or antibacterial soaps.  If your skin becomes reddened/irritated stop using the CHG and inform your nurse when you arrive at Short Stay. Do not shave (including legs and underarms) for at least 48 hours prior to the first CHG shower.  You may shave your face/neck. Please follow these instructions carefully:  1.  Shower with CHG Soap the night before surgery and the  morning of Surgery.  2.  If you choose to wash your hair, wash your hair first  as usual with your  normal  shampoo.  3.  After you shampoo, rinse your hair and body thoroughly to remove the  shampoo.                           4.  Use CHG as you would any other liquid soap.  You can apply chg directly  to the skin and wash                       Gently with a scrungie or clean washcloth.  5.  Apply the CHG Soap to your body ONLY FROM THE NECK DOWN.   Do not use on face/ open                           Wound or open  sores. Avoid contact with eyes, ears mouth and genitals (private parts).                       Wash face,  Genitals (private parts) with your normal soap.             6.  Wash thoroughly, paying special attention to the area where your surgery  will be performed.  7.  Thoroughly rinse your body with warm water from the neck down.  8.  DO NOT shower/wash with your normal soap after using and rinsing off  the CHG Soap.                9.  Pat yourself dry with a clean towel.            10.  Wear clean pajamas.            11.  Place clean sheets on your bed the night of your first shower and do not  sleep with pets. Day of Surgery : Do not apply any lotions/deodorants the morning of surgery.  Please wear clean clothes to the hospital/surgery center.  FAILURE TO FOLLOW THESE INSTRUCTIONS MAY RESULT IN THE CANCELLATION OF YOUR SURGERY PATIENT SIGNATURE_________________________________  NURSE SIGNATURE__________________________________  ________________________________________________________________________   Brian Curtis  An incentive spirometer is a tool that can help keep your lungs clear and active. This tool measures how well you are filling your lungs with each breath. Taking long deep breaths may help reverse or decrease the chance of developing breathing (pulmonary) problems (especially infection) following:  A long period of time when you are unable to move or be active. BEFORE THE PROCEDURE   If the spirometer includes an indicator to show your  best effort, your nurse or respiratory therapist will set it to a desired goal.  If possible, sit up straight or lean slightly forward. Try not to slouch.  Hold the incentive spirometer in an upright position. INSTRUCTIONS FOR USE  1. Sit on the edge of your bed if possible, or sit up as far as you can in bed or on a chair. 2. Hold the incentive spirometer in an upright position. 3. Breathe out normally. 4. Place the mouthpiece in your mouth and seal your lips tightly around it. 5. Breathe in slowly and as deeply as possible, raising the piston or the ball toward the top of the column. 6. Hold your breath for 3-5 seconds or for as long as possible. Allow the piston or ball to fall to the bottom of the column. 7. Remove the mouthpiece from your mouth and breathe out normally. 8. Rest for a few seconds and repeat Steps 1 through 7 at least 10 times every 1-2 hours when you are awake. Take your time and take a few normal breaths between deep breaths. 9. The spirometer may include an indicator to show your best effort. Use the indicator as a goal to work toward during each repetition. 10. After each set of 10 deep breaths, practice coughing to be sure your lungs are clear. If you have an incision (the cut made at the time of surgery), support your incision when coughing by placing a pillow or rolled up towels firmly against it. Once you are able to get out of bed, walk around indoors and cough well. You may stop using the incentive spirometer when instructed by your caregiver.  RISKS AND COMPLICATIONS  Take your time so you do not get dizzy or light-headed.  If you are in pain, you may need to take or ask for pain medication before doing incentive spirometry. It is harder to take a deep breath if you are having pain. AFTER USE  Rest and breathe slowly and easily.  It can be helpful to keep track of a log of your progress. Your caregiver can provide you with a simple table to help with this. If  you are using the spirometer at home, follow these instructions: [name] IF:   You are having difficultly using the spirometer.  You have trouble using the spirometer as often as instructed.  Your pain medication is not giving enough relief while using the spirometer.  You develop fever of 100.5 F (38.1 C) or higher. SEEK IMMEDIATE MEDICAL CARE IF:   You cough up bloody sputum that had not been present before.  You develop fever of 102 F (38.9 C) or greater.  You develop worsening pain at or near the incision site. MAKE SURE YOU:   Understand these instructions.  Will watch your condition.  Will get help right away if you are not doing well or get worse. Document Released: 03/27/2007 Document Revised: 02/06/2012 Document Reviewed: 05/28/2007 [facility] Patient Information 2014 [place], [place].   ________________________________________________________________________

## 2017-12-29 ENCOUNTER — Other Ambulatory Visit: Payer: Self-pay

## 2017-12-29 ENCOUNTER — Encounter (HOSPITAL_COMMUNITY)
Admission: RE | Admit: 2017-12-29 | Discharge: 2017-12-29 | Disposition: A | Discharge status: Home / Self Care | Payer: Medicare Other | Source: Ambulatory Visit | Attending: Orthopaedic Surgery | Admitting: Orthopaedic Surgery

## 2017-12-29 ENCOUNTER — Encounter (HOSPITAL_COMMUNITY): Payer: Self-pay

## 2017-12-29 DIAGNOSIS — R001 Bradycardia, unspecified: Secondary | ICD-10-CM | POA: Diagnosis not present

## 2017-12-29 DIAGNOSIS — Z01810 Encounter for preprocedural cardiovascular examination: Secondary | ICD-10-CM | POA: Diagnosis not present

## 2017-12-29 DIAGNOSIS — I10 Essential (primary) hypertension: Secondary | ICD-10-CM | POA: Diagnosis not present

## 2017-12-29 DIAGNOSIS — Z01812 Encounter for preprocedural laboratory examination: Secondary | ICD-10-CM | POA: Diagnosis not present

## 2017-12-29 DIAGNOSIS — M1612 Unilateral primary osteoarthritis, left hip: Secondary | ICD-10-CM | POA: Diagnosis not present

## 2017-12-29 HISTORY — PX: REPLACEMENT TOTAL HIP W/  RESURFACING IMPLANTS: SUR1222

## 2017-12-29 LAB — BASIC METABOLIC PANEL
ANION GAP: 7 (ref 5–15)
BUN: 20 mg/dL (ref 6–20)
CALCIUM: 9.4 mg/dL (ref 8.9–10.3)
CHLORIDE: 104 mmol/L (ref 101–111)
CO2: 28 mmol/L (ref 22–32)
CREATININE: 0.77 mg/dL (ref 0.61–1.24)
GFR calc non Af Amer: >60 mL/min (ref >60)
Glucose, Bld: 79 mg/dL (ref 65–99)
Potassium: 4 mmol/L (ref 3.5–5.1)
SODIUM: 139 mmol/L (ref 135–145)

## 2017-12-29 LAB — CBC
HCT: 37.4 % — ABNORMAL LOW (ref 39.0–52.0)
Hemoglobin: 12.8 g/dL — ABNORMAL LOW (ref 13.0–17.0)
MCH: 31.4 pg (ref 26.0–34.0)
MCHC: 34.2 g/dL (ref 30.0–36.0)
MCV: 91.7 fL (ref 78.0–100.0)
Platelets: 185 10*3/uL (ref 150–400)
RBC: 4.08 MIL/uL — ABNORMAL LOW (ref 4.22–5.81)
RDW: 12.8 % (ref 11.5–15.5)
WBC: 3.5 10*3/uL — ABNORMAL LOW (ref 4.0–10.5)

## 2017-12-29 LAB — ABO/RH: ABO/RH(D): A POS

## 2017-12-29 LAB — SURGICAL PCR SCREEN
MRSA, PCR: NEGATIVE
Staphylococcus aureus: POSITIVE — AB

## 2018-01-02 ENCOUNTER — Ambulatory Visit (INDEPENDENT_AMBULATORY_CARE_PROVIDER_SITE_OTHER): Payer: Medicare Other | Admitting: Pediatrics

## 2018-01-02 ENCOUNTER — Encounter: Payer: Self-pay | Admitting: Pediatrics

## 2018-01-02 VITALS — BP 120/64 | HR 64 | Temp 97.6°F | Resp 16 | Ht 68.0 in | Wt 152.8 lb

## 2018-01-02 DIAGNOSIS — I10 Essential (primary) hypertension: Secondary | ICD-10-CM

## 2018-01-02 DIAGNOSIS — J3089 Other allergic rhinitis: Secondary | ICD-10-CM | POA: Diagnosis not present

## 2018-01-02 NOTE — Progress Notes (Signed)
  [place] [address] Dept: [phone]  FOLLOW UP NOTE  Patient ID: Brian Curtis, male    DOB: 1946/01/19  Age: 72 y.o. MRN: [id number] Date of Office Visit: 01/02/2018  Assessment  Chief Complaint: Allergic Rhinitis  (doing well.  ITX good.)  HPI Brian Curtis presents for follow-up of allergic rhinitis. He is on allergy injections every 3 weeks and has had excellent control of his symptoms.. He would like to continue on allergy injections every 3 weeks. He does nasal saline irrigations at night. He is not having to use an antihistamine or a steroid nasal spray   Drug Allergies:  No Known Allergies  Physical Exam: BP 120/64 (BP Location: Right Arm, Patient Position: Sitting, Cuff Size: Normal)   Pulse 64   Temp 97.6 F (36.4 C) (Oral)   Resp 16   Ht 5\' 8"  (1.727 m)   Wt 152 lb 12.8 oz (69.3 kg)   SpO2 98%   BMI 23.23 kg/m    Physical Exam  Constitutional: He is oriented to person, place, and time. He appears well-developed and well-nourished.  HENT:  Eyes normal. Ears normal. Nose normal. Pharynx normal.  Neck: Neck supple.  Cardiovascular:  S1 and S2 normal no murmurs  Pulmonary/Chest:  Clear to percussion and auscultation  Lymphadenopathy:    He has no cervical adenopathy.  Neurological: He is alert and oriented to person, place, and time.  Psychiatric: He has a normal mood and affect. His behavior is normal. Judgment and thought content normal.  Vitals reviewed.   Diagnostics:    Assessment and Plan: 1. Essential hypertension   2. Other allergic rhinitis        Patient Instructions  Continue on allergy injections every 3 weeks Continue nasal saline irrigations at night Loratadine 10 mg once a day if needed for runny nose Continue on your other medications Call me if you're not doing well on this treatment plan   Return in about 1 year (around 01/02/2019).    Thank you for the opportunity to care for this patient.  Please do  not hesitate to contact me with questions.  Penne Lash, M.D.  [facility] [address] [place], [place] [address] [phone]

## 2018-01-02 NOTE — Patient Instructions (Signed)
Continue on allergy injections every 3 weeks Continue nasal saline irrigations at night Loratadine 10 mg once a day if needed for runny nose Continue on your other medications Call me if you're not doing well on this treatment plan

## 2018-01-04 MED ORDER — TRANEXAMIC ACID 1000 MG/10ML IV SOLN
1000.0000 mg | INTRAVENOUS | Status: AC
  Administered 2018-01-05: 1000 mg via INTRAVENOUS
  Filled 2018-01-04: qty 1100

## 2018-01-04 NOTE — Progress Notes (Signed)
PATIENT AWARE OF TIME CHANGE. PATIENT TO CHECK IN AT ADMITTING AT 0630. STILL NPO AFTER MIDNIGHT , SIPS WITH MEDS IF APPLICABLE

## 2018-01-04 NOTE — Anesthesia Preprocedure Evaluation (Signed)
Anesthesia Evaluation  Patient identified by MRN, date of birth, ID band Patient awake    Reviewed: Allergy & Precautions, H&P , Patient's Chart, lab work & pertinent test results  Airway Mallampati: II  TM Distance: >3 FB Neck ROM: full    Dental no notable dental hx.    Pulmonary    Pulmonary exam normal breath sounds clear to auscultation       Cardiovascular Exercise Tolerance: Good hypertension,  Rhythm:regular Rate:Normal     Neuro/Psych    GI/Hepatic   Endo/Other    Renal/GU      Musculoskeletal   Abdominal   Peds  Hematology   Anesthesia Other Findings   Reproductive/Obstetrics                             Anesthesia Physical Anesthesia Plan  ASA: II  Anesthesia Plan: Spinal   Post-op Pain Management:    Induction:   PONV Risk Score and Plan: 2 and Treatment may vary due to age or medical condition, Dexamethasone and Ondansetron  Airway Management Planned:   Additional Equipment:   Intra-op Plan:   Post-operative Plan:   Informed Consent: I have reviewed the patients History and Physical, chart, labs and discussed the procedure including the risks, benefits and alternatives for the proposed anesthesia with the patient or authorized representative who has indicated his/her understanding and acceptance.     Plan Discussed with:   Anesthesia Plan Comments: (  )        Anesthesia Quick Evaluation

## 2018-01-05 ENCOUNTER — Inpatient Hospital Stay (HOSPITAL_COMMUNITY): Payer: Medicare Other

## 2018-01-05 ENCOUNTER — Inpatient Hospital Stay (HOSPITAL_COMMUNITY): Payer: Medicare Other | Admitting: Anesthesiology

## 2018-01-05 ENCOUNTER — Encounter (HOSPITAL_COMMUNITY): Payer: Self-pay | Admitting: Anesthesiology

## 2018-01-05 ENCOUNTER — Inpatient Hospital Stay (HOSPITAL_COMMUNITY)
Admission: RE | Admit: 2018-01-05 | Discharge: 2018-01-07 | DRG: 470 | Disposition: A | Discharge status: Home Health | Payer: Medicare Other | Source: Ambulatory Visit | Attending: Orthopaedic Surgery | Admitting: Orthopaedic Surgery

## 2018-01-05 ENCOUNTER — Other Ambulatory Visit: Payer: Self-pay

## 2018-01-05 ENCOUNTER — Encounter (HOSPITAL_COMMUNITY)
Admission: RE | Disposition: A | Discharge status: Home Health | Payer: Self-pay | Source: Ambulatory Visit | Attending: Orthopaedic Surgery

## 2018-01-05 DIAGNOSIS — M1612 Unilateral primary osteoarthritis, left hip: Secondary | ICD-10-CM | POA: Diagnosis not present

## 2018-01-05 DIAGNOSIS — K5790 Diverticulosis of intestine, part unspecified, without perforation or abscess without bleeding: Secondary | ICD-10-CM | POA: Diagnosis present

## 2018-01-05 DIAGNOSIS — M329 Systemic lupus erythematosus, unspecified: Secondary | ICD-10-CM | POA: Diagnosis present

## 2018-01-05 DIAGNOSIS — Z96642 Presence of left artificial hip joint: Secondary | ICD-10-CM

## 2018-01-05 DIAGNOSIS — M25552 Pain in left hip: Secondary | ICD-10-CM | POA: Diagnosis present

## 2018-01-05 DIAGNOSIS — Z800 Family history of malignant neoplasm of digestive organs: Secondary | ICD-10-CM | POA: Diagnosis not present

## 2018-01-05 DIAGNOSIS — M810 Age-related osteoporosis without current pathological fracture: Secondary | ICD-10-CM | POA: Diagnosis present

## 2018-01-05 DIAGNOSIS — Z823 Family history of stroke: Secondary | ICD-10-CM

## 2018-01-05 DIAGNOSIS — J309 Allergic rhinitis, unspecified: Secondary | ICD-10-CM | POA: Diagnosis present

## 2018-01-05 DIAGNOSIS — I10 Essential (primary) hypertension: Secondary | ICD-10-CM | POA: Diagnosis present

## 2018-01-05 DIAGNOSIS — Z87442 Personal history of urinary calculi: Secondary | ICD-10-CM

## 2018-01-05 DIAGNOSIS — Z419 Encounter for procedure for purposes other than remedying health state, unspecified: Secondary | ICD-10-CM

## 2018-01-05 HISTORY — PX: TOTAL HIP ARTHROPLASTY: SHX124

## 2018-01-05 LAB — TYPE AND SCREEN
ABO/RH(D): A POS
Antibody Screen: NEGATIVE

## 2018-01-05 SURGERY — ARTHROPLASTY, HIP, TOTAL, ANTERIOR APPROACH
Anesthesia: Spinal | Laterality: Left

## 2018-01-05 MED ORDER — MIDAZOLAM HCL 2 MG/2ML IJ SOLN
INTRAMUSCULAR | Status: AC
  Filled 2018-01-05: qty 2

## 2018-01-05 MED ORDER — FENTANYL CITRATE (PF) 100 MCG/2ML IJ SOLN
INTRAMUSCULAR | Status: DC | PRN
  Administered 2018-01-05: 50 ug via INTRAVENOUS
  Administered 2018-01-05 (×2): 25 ug via INTRAVENOUS

## 2018-01-05 MED ORDER — METHOCARBAMOL 1000 MG/10ML IJ SOLN
500.0000 mg | Freq: Four times a day (QID) | INTRAVENOUS | Status: DC | PRN
  Administered 2018-01-05: 500 mg via INTRAVENOUS
  Filled 2018-01-05: qty 550

## 2018-01-05 MED ORDER — OXYCODONE HCL 5 MG PO TABS
10.0000 mg | ORAL_TABLET | ORAL | Status: DC | PRN
  Administered 2018-01-06: 10 mg via ORAL
  Filled 2018-01-05: qty 2

## 2018-01-05 MED ORDER — LACTATED RINGERS IV SOLN
INTRAVENOUS | Status: DC | PRN
  Administered 2018-01-05: 1000 mL via INTRAVENOUS
  Administered 2018-01-05 (×2): via INTRAVENOUS

## 2018-01-05 MED ORDER — ACETAMINOPHEN 325 MG PO TABS: 650.0000 mg | ORAL_TABLET | ORAL | Status: DC | PRN

## 2018-01-05 MED ORDER — ACETAMINOPHEN 650 MG RE SUPP: 650.0000 mg | RECTAL | Status: DC | PRN

## 2018-01-05 MED ORDER — CEFAZOLIN SODIUM-DEXTROSE 2-4 GM/100ML-% IV SOLN
2.0000 g | INTRAVENOUS | Status: AC
  Administered 2018-01-05: 2 g via INTRAVENOUS
  Filled 2018-01-05: qty 100

## 2018-01-05 MED ORDER — POLYETHYLENE GLYCOL 3350 17 G PO PACK: 17.0000 g | PACK | Freq: Every day | ORAL | Status: DC | PRN

## 2018-01-05 MED ORDER — ALUM & MAG HYDROXIDE-SIMETH 200-200-20 MG/5ML PO SUSP: 30.0000 mL | ORAL | Status: DC | PRN

## 2018-01-05 MED ORDER — ONDANSETRON HCL 4 MG PO TABS: 4.0000 mg | ORAL_TABLET | Freq: Four times a day (QID) | ORAL | Status: DC | PRN

## 2018-01-05 MED ORDER — HYDROMORPHONE HCL 1 MG/ML IJ SOLN: 1.0000 mg | INTRAMUSCULAR | Status: DC | PRN

## 2018-01-05 MED ORDER — EPHEDRINE SULFATE-NACL 50-0.9 MG/10ML-% IV SOSY
PREFILLED_SYRINGE | INTRAVENOUS | Status: DC | PRN
  Administered 2018-01-05 (×2): 5 mg via INTRAVENOUS

## 2018-01-05 MED ORDER — DOCUSATE SODIUM 100 MG PO CAPS
100.0000 mg | ORAL_CAPSULE | Freq: Two times a day (BID) | ORAL | Status: DC
  Administered 2018-01-05 – 2018-01-07 (×4): 100 mg via ORAL
  Filled 2018-01-05 (×4): qty 1

## 2018-01-05 MED ORDER — HYDROCODONE-ACETAMINOPHEN 5-325 MG PO TABS
1.0000 | ORAL_TABLET | ORAL | Status: DC | PRN
  Administered 2018-01-05: 2 via ORAL
  Administered 2018-01-05 (×2): 1 via ORAL
  Administered 2018-01-06 – 2018-01-07 (×4): 2 via ORAL
  Filled 2018-01-05 (×5): qty 2
  Filled 2018-01-05 (×2): qty 1

## 2018-01-05 MED ORDER — DIPHENHYDRAMINE HCL 12.5 MG/5ML PO ELIX: 12.5000 mg | ORAL_SOLUTION | ORAL | Status: DC | PRN

## 2018-01-05 MED ORDER — METHOCARBAMOL 500 MG PO TABS: 500.0000 mg | ORAL_TABLET | Freq: Four times a day (QID) | ORAL | Status: DC | PRN

## 2018-01-05 MED ORDER — DEXAMETHASONE SODIUM PHOSPHATE 10 MG/ML IJ SOLN
INTRAMUSCULAR | Status: DC | PRN
  Administered 2018-01-05: 10 mg via INTRAVENOUS

## 2018-01-05 MED ORDER — FENTANYL CITRATE (PF) 100 MCG/2ML IJ SOLN
INTRAMUSCULAR | Status: AC
  Filled 2018-01-05: qty 2

## 2018-01-05 MED ORDER — LISINOPRIL 10 MG PO TABS: 10.0000 mg | ORAL_TABLET | Freq: Every evening | ORAL | Status: DC

## 2018-01-05 MED ORDER — SODIUM CHLORIDE 0.9 % IR SOLN
Status: DC | PRN
  Administered 2018-01-05: 1000 mL

## 2018-01-05 MED ORDER — PROPOFOL 500 MG/50ML IV EMUL
INTRAVENOUS | Status: DC | PRN
  Administered 2018-01-05: 75 ug/kg/min via INTRAVENOUS

## 2018-01-05 MED ORDER — CEFAZOLIN SODIUM-DEXTROSE 1-4 GM/50ML-% IV SOLN
1.0000 g | Freq: Four times a day (QID) | INTRAVENOUS | Status: AC
  Administered 2018-01-05 (×2): 1 g via INTRAVENOUS
  Filled 2018-01-05 (×2): qty 50

## 2018-01-05 MED ORDER — MENTHOL 3 MG MT LOZG: 1.0000 | LOZENGE | OROMUCOSAL | Status: DC | PRN

## 2018-01-05 MED ORDER — MIDAZOLAM HCL 5 MG/5ML IJ SOLN
INTRAMUSCULAR | Status: DC | PRN
  Administered 2018-01-05: 2 mg via INTRAVENOUS

## 2018-01-05 MED ORDER — METOCLOPRAMIDE HCL 5 MG PO TABS: 5.0000 mg | ORAL_TABLET | Freq: Three times a day (TID) | ORAL | Status: DC | PRN

## 2018-01-05 MED ORDER — PROPOFOL 10 MG/ML IV BOLUS
INTRAVENOUS | Status: AC
  Filled 2018-01-05: qty 60

## 2018-01-05 MED ORDER — 0.9 % SODIUM CHLORIDE (POUR BTL) OPTIME
TOPICAL | Status: DC | PRN
  Administered 2018-01-05: 1000 mL

## 2018-01-05 MED ORDER — EPHEDRINE 5 MG/ML INJ
INTRAVENOUS | Status: AC
  Filled 2018-01-05: qty 10

## 2018-01-05 MED ORDER — PHENYLEPHRINE 40 MCG/ML (10ML) SYRINGE FOR IV PUSH (FOR BLOOD PRESSURE SUPPORT)
PREFILLED_SYRINGE | INTRAVENOUS | Status: DC | PRN
  Administered 2018-01-05 (×4): 40 ug via INTRAVENOUS

## 2018-01-05 MED ORDER — FENTANYL CITRATE (PF) 100 MCG/2ML IJ SOLN
25.0000 ug | INTRAMUSCULAR | Status: DC | PRN
  Administered 2018-01-05 (×2): 50 ug via INTRAVENOUS

## 2018-01-05 MED ORDER — PHENYLEPHRINE 40 MCG/ML (10ML) SYRINGE FOR IV PUSH (FOR BLOOD PRESSURE SUPPORT)
PREFILLED_SYRINGE | INTRAVENOUS | Status: AC
  Filled 2018-01-05: qty 10

## 2018-01-05 MED ORDER — ONDANSETRON HCL 4 MG/2ML IJ SOLN
INTRAMUSCULAR | Status: DC | PRN
  Administered 2018-01-05: 4 mg via INTRAVENOUS

## 2018-01-05 MED ORDER — SODIUM CHLORIDE 0.9 % IV SOLN
INTRAVENOUS | Status: DC
  Administered 2018-01-05 – 2018-01-06 (×2): via INTRAVENOUS

## 2018-01-05 MED ORDER — ONDANSETRON HCL 4 MG/2ML IJ SOLN: 4.0000 mg | Freq: Four times a day (QID) | INTRAMUSCULAR | Status: DC | PRN

## 2018-01-05 MED ORDER — LISINOPRIL 10 MG PO TABS
10.0000 mg | ORAL_TABLET | Freq: Every evening | ORAL | Status: DC
  Administered 2018-01-05 – 2018-01-06 (×2): 10 mg via ORAL
  Filled 2018-01-05 (×2): qty 1

## 2018-01-05 MED ORDER — ZOLPIDEM TARTRATE 5 MG PO TABS: 5.0000 mg | ORAL_TABLET | Freq: Every evening | ORAL | Status: DC | PRN

## 2018-01-05 MED ORDER — ASPIRIN 81 MG PO CHEW
81.0000 mg | CHEWABLE_TABLET | Freq: Two times a day (BID) | ORAL | Status: DC
  Administered 2018-01-05 – 2018-01-07 (×4): 81 mg via ORAL
  Filled 2018-01-05 (×4): qty 1

## 2018-01-05 MED ORDER — PHENOL 1.4 % MT LIQD
1.0000 | OROMUCOSAL | Status: DC | PRN
  Filled 2018-01-05: qty 177

## 2018-01-05 MED ORDER — METOCLOPRAMIDE HCL 5 MG/ML IJ SOLN: 5.0000 mg | Freq: Three times a day (TID) | INTRAMUSCULAR | Status: DC | PRN

## 2018-01-05 MED ORDER — CHLORHEXIDINE GLUCONATE 4 % EX LIQD: 60.0000 mL | Freq: Once | CUTANEOUS | Status: DC

## 2018-01-05 SURGICAL SUPPLY — 38 items
BAG ZIPLOCK 12X15 (MISCELLANEOUS) ×3 IMPLANT
BENZOIN TINCTURE PRP APPL 2/3 (GAUZE/BANDAGES/DRESSINGS) ×3 IMPLANT
BLADE SAW SGTL 18X1.27X75 (BLADE) ×2 IMPLANT
BLADE SAW SGTL 18X1.27X75MM (BLADE) ×1
CAPT HIP TOTAL 2 ×3 IMPLANT
CLOSURE WOUND 1/2 X4 (GAUZE/BANDAGES/DRESSINGS) ×1
COVER PERINEAL POST (MISCELLANEOUS) ×3 IMPLANT
COVER SURGICAL LIGHT HANDLE (MISCELLANEOUS) ×3 IMPLANT
DRAPE STERI IOBAN 125X83 (DRAPES) ×3 IMPLANT
DRAPE TOP 10253 STERILE (DRAPES) ×3 IMPLANT
DRAPE U-SHAPE 47X51 STRL (DRAPES) ×6 IMPLANT
DRESSING AQUACEL AG SP 3.5X10 (GAUZE/BANDAGES/DRESSINGS) ×1 IMPLANT
DRSG AQUACEL AG ADV 3.5X10 (GAUZE/BANDAGES/DRESSINGS) IMPLANT
DRSG AQUACEL AG SP 3.5X10 (GAUZE/BANDAGES/DRESSINGS) ×3
DURAPREP 26ML APPLICATOR (WOUND CARE) ×3 IMPLANT
ELECT REM PT RETURN 15FT ADLT (MISCELLANEOUS) ×3 IMPLANT
GAUZE XEROFORM 1X8 LF (GAUZE/BANDAGES/DRESSINGS) IMPLANT
GLOVE BIO SURGEON STRL SZ7.5 (GLOVE) ×3 IMPLANT
GLOVE BIOGEL PI IND STRL 8 (GLOVE) ×2 IMPLANT
GLOVE BIOGEL PI INDICATOR 8 (GLOVE) ×4
GLOVE ECLIPSE 8.0 STRL XLNG CF (GLOVE) ×3 IMPLANT
GOWN STRL REUS W/TWL XL LVL3 (GOWN DISPOSABLE) ×6 IMPLANT
HANDPIECE INTERPULSE COAX TIP (DISPOSABLE) ×2
HEAD CERAMIC DELTA 36 PLUS 1.5 (Hips) ×3 IMPLANT
HOLDER FOLEY CATH W/STRAP (MISCELLANEOUS) ×3 IMPLANT
PACK ANTERIOR HIP CUSTOM (KITS) ×3 IMPLANT
SET HNDPC FAN SPRY TIP SCT (DISPOSABLE) ×1 IMPLANT
STAPLER VISISTAT 35W (STAPLE) IMPLANT
STRIP CLOSURE SKIN 1/2X4 (GAUZE/BANDAGES/DRESSINGS) ×2 IMPLANT
SUT ETHIBOND NAB CT1 #1 30IN (SUTURE) ×3 IMPLANT
SUT MNCRL AB 4-0 PS2 18 (SUTURE) ×3 IMPLANT
SUT VIC AB 0 CT1 36 (SUTURE) ×3 IMPLANT
SUT VIC AB 1 CT1 36 (SUTURE) ×3 IMPLANT
SUT VIC AB 2-0 CT1 27 (SUTURE) ×4
SUT VIC AB 2-0 CT1 TAPERPNT 27 (SUTURE) ×2 IMPLANT
TRAY FOLEY W/METER SILVER 16FR (SET/KITS/TRAYS/PACK) ×3 IMPLANT
WATER STERILE IRR 1000ML POUR (IV SOLUTION) ×6 IMPLANT
YANKAUER SUCT BULB TIP 10FT TU (MISCELLANEOUS) ×3 IMPLANT

## 2018-01-05 NOTE — Anesthesia Procedure Notes (Signed)
Spinal  Patient location during procedure: OR Staffing Anesthesiologist: Lyndle Herrlich, MD Spinal Block Patient position: sitting Prep: DuraPrep Patient monitoring: heart rate, blood pressure and continuous pulse ox Approach: right paramedian Location: L3-4 Injection technique: single-shot Needle Needle type: Sprotte  Needle gauge: 24 G Needle length: 9 cm Assessment Sensory level: T4 Additional Notes Spinal Dosage in OR  .75% Bupivicaine ml       1.8

## 2018-01-05 NOTE — H&P (Signed)
TOTAL HIP ADMISSION H&P  Patient is admitted for left total hip arthroplasty.  Subjective:  Chief Complaint: left hip pain  HPI: Brian Curtis, 72 y.o. male, has a history of pain and functional disability in the left hip(s) due to arthritis and patient has failed non-surgical conservative treatments for greater than 12 weeks to include NSAID's and/or analgesics, corticosteriod injections and activity modification.  Onset of symptoms was gradual starting 3 years ago with gradually worsening course since that time.The patient noted no past surgery on the left hip(s).  Patient currently rates pain in the left hip at 10 out of 10 with activity. Patient has night pain, worsening of pain with activity and weight bearing, trendelenberg gait, pain that interfers with activities of daily living and pain with passive range of motion. Patient has evidence of subchondral sclerosis, periarticular osteophytes and joint space narrowing by imaging studies. This condition presents safety issues increasing the risk of falls.  There is no current active infection.  Patient Active Problem List   Diagnosis Date Noted  . Unilateral primary osteoarthritis, left hip 09/04/2017  . DD (diverticular disease) 12/17/2015  . Calculus of kidney 12/17/2015  . Seasonal allergic conjunctivitis 12/17/2015  . Viral upper respiratory tract infection 11/10/2015  . Other allergic rhinitis 11/10/2015  . Essential hypertension 11/10/2015  . Allergic rhinitis 07/31/2015  . BP (high blood pressure) 10/04/2011   Past Medical History:  Diagnosis Date  . Allergy    allergy shots every 3 weeks  . Hypertension   . Lupus    MANAGED BY DR Allyn Kenner; PER PATIENT "HE SAY I AM IN REMISSION AND IT ONLY REALLY AFFECTED THE SKIN . I SEE HIM ONCE EVERY YEAR"   . Osteoporosis    debateable per pt.     Past Surgical History:  Procedure Laterality Date  . APPENDECTOMY  07/1983  . COLONOSCOPY  11/2003  . ESOPHAGOGASTRODUODENOSCOPY ENDOSCOPY   11/2003  . HEMORROIDECTOMY  02/2006   with blood clot  . HERNIA REPAIR  10/2010   "double" per pt.  Marland Kitchen KIDNEY STONE SURGERY  05/2001    Current Facility-Administered Medications  Medication Dose Route Frequency Provider Last Rate Last Dose  . ceFAZolin (ANCEF) IVPB 2g/100 mL premix  2 g Intravenous On Call to OR Pete Pelt, PA-C      . chlorhexidine (HIBICLENS) 4 % liquid 4 application  60 mL Topical Once Erskine Emery W, PA-C      . tranexamic acid (CYKLOKAPRON) 1,000 mg in sodium chloride 0.9 % 100 mL IVPB  1,000 mg Intravenous To OR Mcarthur Rossetti, MD       Facility-Administered Medications Ordered in Other Encounters  Medication Dose Route Frequency Provider Last Rate Last Dose  . lactated ringers infusion   Intravenous Continuous PRN Key, Kristopher, CRNA       No Known Allergies  Social History   Tobacco Use  . Smoking status: Never Smoker  . Smokeless tobacco: Never Used  Substance Use Topics  . Alcohol use: Yes    Alcohol/week: 0.6 oz    Types: 1 Glasses of wine per week    Comment: rare socail    Family History  Problem Relation Age of Onset  . Colon cancer Father   . Stroke Father   . Allergic rhinitis Neg Hx   . Angioedema Neg Hx   . Asthma Neg Hx   . Eczema Neg Hx   . Immunodeficiency Neg Hx   . Urticaria Neg Hx  Review of Systems  Musculoskeletal: Positive for joint pain.  All other systems reviewed and are negative.   Objective:  Physical Exam  Constitutional: He is oriented to person, place, and time. He appears well-developed and well-nourished.  HENT:  Head: Normocephalic and atraumatic.  Eyes: EOM are normal. Pupils are equal, round, and reactive to light.  Neck: Normal range of motion. Neck supple.  Cardiovascular: Normal rate and regular rhythm.  Respiratory: Effort normal and breath sounds normal.  GI: Soft. Bowel sounds are normal.  Musculoskeletal:       Left hip: He exhibits decreased range of motion, decreased  strength, tenderness and bony tenderness.  Neurological: He is alert and oriented to person, place, and time.  Skin: Skin is warm and dry.  Psychiatric: He has a normal mood and affect.    Vital signs in last 24 hours: Temp:  [98.4 F (36.9 C)] 98.4 F (36.9 C) (02/08 0647) Pulse Rate:  [59] 59 (02/08 0647) Resp:  [16] 16 (02/08 0647) BP: (118)/(96) 118/96 (02/08 0647) SpO2:  [100 %] 100 % (02/08 0647) Weight:  [152 lb 12.8 oz (69.3 kg)] 152 lb 12.8 oz (69.3 kg) (02/08 0647)  Labs:   Estimated body mass index is 23.23 kg/m as calculated from the following:   Height as of this encounter: 5\' 8"  (1.727 m).   Weight as of this encounter: 152 lb 12.8 oz (69.3 kg).   Imaging Review Plain radiographs demonstrate severe degenerative joint disease of the left hip(s). The bone quality appears to be good for age and reported activity level.  Assessment/Plan:  End stage arthritis, left hip(s)  The patient history, physical examination, clinical judgement of the provider and imaging studies are consistent with end stage degenerative joint disease of the left hip(s) and total hip arthroplasty is deemed medically necessary. The treatment options including medical management, injection therapy, arthroscopy and arthroplasty were discussed at length. The risks and benefits of total hip arthroplasty were presented and reviewed. The risks due to aseptic loosening, infection, stiffness, dislocation/subluxation,  thromboembolic complications and other imponderables were discussed.  The patient acknowledged the explanation, agreed to proceed with the plan and consent was signed. Patient is being admitted for inpatient treatment for surgery, pain control, PT, OT, prophylactic antibiotics, VTE prophylaxis, progressive ambulation and ADL's and discharge planning.The patient is planning to be discharged home with home health services

## 2018-01-05 NOTE — Brief Op Note (Signed)
01/05/2018  10:36 AM  PATIENT:  Brian Curtis  72 y.o. male  PRE-OPERATIVE DIAGNOSIS:  Osteoarthritis Left Hip  POST-OPERATIVE DIAGNOSIS:  Osteoarthritis Left Hip  PROCEDURE:  Procedure(s): LEFT TOTAL HIP ARTHROPLASTY ANTERIOR APPROACH (Left)  SURGEON:  Surgeon(s) and Role:    Mcarthur Rossetti, MD - Primary  PHYSICIAN ASSISTANT: Benita Stabile, PA-C  ANESTHESIA:   spinal  EBL:  300 mL   COUNTS:  YES  DICTATION: .Other Dictation: Dictation Number [id number]  PLAN OF CARE: Admit to inpatient   PATIENT DISPOSITION:  PACU - hemodynamically stable.   Delay start of Pharmacological VTE agent (>24hrs) due to surgical blood loss or risk of bleeding: no

## 2018-01-05 NOTE — Transfer of Care (Signed)
Immediate Anesthesia Transfer of Care Note  Patient: Brian Curtis  Procedure(s) Performed: LEFT TOTAL HIP ARTHROPLASTY ANTERIOR APPROACH (Left )  Patient Location: PACU  Anesthesia Type:Spinal  Level of Consciousness: awake, alert  and patient cooperative  Airway & Oxygen Therapy: Patient Spontanous Breathing and Patient connected to face mask oxygen  Post-op Assessment: Report given to RN and Post -op Vital signs reviewed and stable  Post vital signs: Reviewed and stable  Last Vitals:  Vitals:   01/05/18 0647  BP: (!) 118/96  Pulse: (!) 59  Resp: 16  Temp: 36.9 C  SpO2: 100%    Last Pain:  Vitals:   01/05/18 0647  TempSrc: Oral         Complications: No apparent anesthesia complications

## 2018-01-05 NOTE — Anesthesia Postprocedure Evaluation (Addendum)
Anesthesia Post Note  Patient: Brian Curtis  Procedure(s) Performed: LEFT TOTAL HIP ARTHROPLASTY ANTERIOR APPROACH (Left )     Patient location during evaluation: PACU Anesthesia Type: Spinal Level of consciousness: awake Pain management: satisfactory to patient Vital Signs Assessment: post-procedure vital signs reviewed and stable Respiratory status: spontaneous breathing Cardiovascular status: blood pressure returned to baseline Postop Assessment: no headache and spinal receding Anesthetic complications: no    Last Vitals:  Vitals:   01/05/18 1640 01/05/18 1745  BP: 117/70 (!) 121/95  Pulse: 69 68  Resp: 16 16  Temp: 36.7 C 36.6 C  SpO2: 95% 95%    Last Pain:  Vitals:   01/05/18 1745  TempSrc: Oral  PainSc:                  Riccardo Dubin

## 2018-01-05 NOTE — Evaluation (Signed)
Physical Therapy Evaluation Patient Details Name: Brian Curtis MRN: [id number] DOB: 15-Jul-1946 Today's Date: 01/05/2018   History of Present Illness  Pt s/p L THR and with hx of lupus and osteoporosis  Clinical Impression  Pt s/p L THR and presents with decreased L LE strength/ROM and post op pain limiting functional mobility.  Pt should progress to dc home with family assist.    Follow Up Recommendations Home health PT    Equipment Recommendations  None recommended by PT    Recommendations for Other Services OT consult     Precautions / Restrictions Precautions Precautions: Fall Restrictions Weight Bearing Restrictions: No Other Position/Activity Restrictions: WBAT      Mobility  Bed Mobility Overal bed mobility: Needs Assistance Bed Mobility: Supine to Sit     Supine to sit: Min assist     General bed mobility comments: cues for sequence and use of R LE to self assist  Transfers Overall transfer level: Needs assistance Equipment used: Rolling walker (2 wheeled) Transfers: Sit to/from Stand Sit to Stand: Min assist         General transfer comment: cues for LE management and use of UEs to self assist  Ambulation/Gait Ambulation/Gait assistance: Min assist Ambulation Distance (Feet): 111 Feet Assistive device: Rolling walker (2 wheeled) Gait Pattern/deviations: Step-to pattern;Step-through pattern;Decreased step length - right;Decreased step length - left;Shuffle;Trunk flexed Gait velocity: decr Gait velocity interpretation: Below normal speed for age/gender General Gait Details: cues for posture, position from RW and initial sequence  Stairs            Wheelchair Mobility    Modified Rankin (Stroke Patients Only)       Balance                                             Pertinent Vitals/Pain Pain Assessment: 0-10 Pain Score: 3  Pain Location: L hip Pain Descriptors / Indicators: Aching;Sore Pain Intervention(s):  Limited activity within patient's tolerance;Monitored during session;Premedicated before session;Ice applied    Home Living Family/patient expects to be discharged to:: Private residence Living Arrangements: Spouse/significant other Available Help at Discharge: Family Type of Home: House Home Access: Stairs to enter Entrance Stairs-Rails: Right Entrance Stairs-Number of Steps: 4 Home Layout: Able to live on main level with bedroom/bathroom Home Equipment: Walker - 2 wheels;Cane - single point;Bedside commode      Prior Function Level of Independence: Independent               Hand Dominance        Extremity/Trunk Assessment   Upper Extremity Assessment Upper Extremity Assessment: Overall WFL for tasks assessed    Lower Extremity Assessment Lower Extremity Assessment: LLE deficits/detail    Cervical / Trunk Assessment Cervical / Trunk Assessment: Kyphotic  Communication   Communication: No difficulties  Cognition Arousal/Alertness: Awake/alert Behavior During Therapy: WFL for tasks assessed/performed Overall Cognitive Status: Within Functional Limits for tasks assessed                                        General Comments      Exercises Total Joint Exercises Ankle Circles/Pumps: AROM;Both;15 reps;Supine   Assessment/Plan    PT Assessment Patient needs continued PT services  PT Problem List Decreased strength;Decreased range of motion;Decreased activity tolerance;Decreased mobility;Pain;Decreased  knowledge of use of DME       PT Treatment Interventions DME instruction;Gait training;Stair training;Functional mobility training;Therapeutic activities;Therapeutic exercise;Patient/family education    PT Goals (Current goals can be found in the Care Plan section)  Acute Rehab PT Goals Patient Stated Goal: Regain IND PT Goal Formulation: With patient Time For Goal Achievement: 01/12/18 Potential to Achieve Goals: Good    Frequency  7X/week   Barriers to discharge        Co-evaluation               AM-PAC PT "6 Clicks" Daily Activity  Outcome Measure Difficulty turning over in bed (including adjusting bedclothes, sheets and blankets)?: Unable Difficulty moving from lying on back to sitting on the side of the bed? : Unable Difficulty sitting down on and standing up from a chair with arms (e.g., wheelchair, bedside commode, etc,.)?: Unable Help needed moving to and from a bed to chair (including a wheelchair)?: A Little Help needed walking in hospital room?: A Little Help needed climbing 3-5 steps with a railing? : A Little 6 Click Score: 12    End of Session Equipment Utilized During Treatment: Gait belt Activity Tolerance: Patient tolerated treatment well Patient left: in chair;with call bell/phone within reach;with family/visitor present;with chair alarm set Nurse Communication: Mobility status PT Visit Diagnosis: Difficulty in walking, not elsewhere classified (R26.2)    Time: 8242-3536 PT Time Calculation (min) (ACUTE ONLY): 31 min   Charges:   PT Evaluation $PT Eval Low Complexity: 1 Low PT Treatments $Gait Training: 8-22 mins   PT G Codes:        Pg [phone]    Anjelita Sheahan 01/05/2018, 4:39 PM

## 2018-01-06 LAB — BASIC METABOLIC PANEL
Anion gap: 5 (ref 5–15)
BUN: 12 mg/dL (ref 6–20)
CHLORIDE: 104 mmol/L (ref 101–111)
CO2: 28 mmol/L (ref 22–32)
Calcium: 8.2 mg/dL — ABNORMAL LOW (ref 8.9–10.3)
Creatinine, Ser: 0.69 mg/dL (ref 0.61–1.24)
GFR calc Af Amer: >60 mL/min (ref >60)
GFR calc non Af Amer: >60 mL/min (ref >60)
GLUCOSE: 97 mg/dL (ref 65–99)
POTASSIUM: 3.8 mmol/L (ref 3.5–5.1)
Sodium: 137 mmol/L (ref 135–145)

## 2018-01-06 LAB — CBC
HCT: 31 % — ABNORMAL LOW (ref 39.0–52.0)
HEMOGLOBIN: 10.8 g/dL — AB (ref 13.0–17.0)
MCH: 31.7 pg (ref 26.0–34.0)
MCHC: 34.8 g/dL (ref 30.0–36.0)
MCV: 90.9 fL (ref 78.0–100.0)
Platelets: 153 10*3/uL (ref 150–400)
RBC: 3.41 MIL/uL — AB (ref 4.22–5.81)
RDW: 12.8 % (ref 11.5–15.5)
WBC: 6.9 10*3/uL (ref 4.0–10.5)

## 2018-01-06 NOTE — Progress Notes (Signed)
Physical Therapy Treatment Patient Details Name: Brian Curtis MRN: [id number] DOB: [birth year] Today's Date: 01/06/2018    History of Present Illness Pt s/p L THR and with hx of lupus and osteoporosis    PT Comments    POD # 1 am session Assisted with amb in hallway then performed some THR TE's followed by ICE.   Follow Up Recommendations  Home health PT     Equipment Recommendations  None recommended by PT    Recommendations for Other Services       Precautions / Restrictions Precautions Precautions: Fall Restrictions Other Position/Activity Restrictions: WBAT    Mobility  Bed Mobility               General bed mobility comments: OOB in recliner  Transfers Overall transfer level: Needs assistance Equipment used: Rolling walker (2 wheeled) Transfers: Sit to/from Stand Sit to Stand: Min guard         General transfer comment: for safety. Cues for UE/LE placement  Ambulation/Gait Ambulation/Gait assistance: Min assist Ambulation Distance (Feet): 85 Feet Assistive device: Rolling walker (2 wheeled) Gait Pattern/deviations: Step-to pattern;Step-through pattern;Decreased step length - right;Decreased step length - left;Shuffle;Trunk flexed Gait velocity: decr   General Gait Details: cues for posture, position from RW and initial sequence   Stairs            Wheelchair Mobility    Modified Rankin (Stroke Patients Only)       Balance                                            Cognition Arousal/Alertness: Awake/alert Behavior During Therapy: WFL for tasks assessed/performed Overall Cognitive Status: Within Functional Limits for tasks assessed                                        Exercises   Total Hip Replacement TE's 10 reps ankle pumps 10 reps knee presses 10 reps heel slides 10 reps SAQ's 10 reps ABD Followed by ICE    General Comments        Pertinent Vitals/Pain Pain Assessment:  0-10 Pain Score: 6  Pain Location: L hip Pain Descriptors / Indicators: Aching;Sore Pain Intervention(s): Monitored during session;Repositioned;Ice applied    Home Living                      Prior Function            PT Goals (current goals can now be found in the care plan section) Progress towards PT goals: Progressing toward goals    Frequency    7X/week      PT Plan Current plan remains appropriate    Co-evaluation              AM-PAC PT "6 Clicks" Daily Activity  Outcome Measure  Difficulty turning over in bed (including adjusting bedclothes, sheets and blankets)?: Unable Difficulty moving from lying on back to sitting on the side of the bed? : Unable Difficulty sitting down on and standing up from a chair with arms (e.g., wheelchair, bedside commode, etc,.)?: Unable Help needed moving to and from a bed to chair (including a wheelchair)?: A Little Help needed walking in hospital room?: A Little Help needed climbing 3-5 steps with a railing? : A  Little 6 Click Score: 12    End of Session Equipment Utilized During Treatment: Gait belt Activity Tolerance: Patient tolerated treatment well Patient left: in chair;with call bell/phone within reach;with family/visitor present;with chair alarm set Nurse Communication: Mobility status PT Visit Diagnosis: Difficulty in walking, not elsewhere classified (R26.2)     Time: 7741-4239 PT Time Calculation (min) (ACUTE ONLY): 25 min  Charges:  $Gait Training: 8-22 mins $Therapeutic Exercise: 8-22 mins                    G Codes:       {Brian Curtis  PTA [facility]  Acute  Rehab Pager      [phone]

## 2018-01-06 NOTE — Progress Notes (Signed)
Subjective: 1 Day Post-Op Procedure(s) (LRB): LEFT TOTAL HIP ARTHROPLASTY ANTERIOR APPROACH (Left) Patient reports pain as moderate.    Objective: Vital signs in last 24 hours: Temp:  [97.2 F (36.2 C)-99 F (37.2 C)] 97.8 F (36.6 C) (02/09 1048) Pulse Rate:  [55-85] 65 (02/09 1048) Resp:  [11-23] 16 (02/09 1048) BP: (109-146)/(62-110) 116/62 (02/09 1048) SpO2:  [95 %-100 %] 100 % (02/09 1048)  Intake/Output from previous day: 02/08 0701 - 02/09 0700 In: 3845.8 [P.O.:1030; I.V.:2550.8; IV Piggyback:265] Out: 2575 [Urine:2275; Blood:300] Intake/Output this shift: Total I/O In: 240 [P.O.:240] Out: 550 [Urine:550]  Recent Labs    01/06/18 0545  HGB 10.8*   Recent Labs    01/06/18 0545  WBC 6.9  RBC 3.41*  HCT 31.0*  PLT 153   Recent Labs    01/06/18 0545  NA 137  K 3.8  CL 104  CO2 28  BUN 12  CREATININE 0.69  GLUCOSE 97  CALCIUM 8.2*   No results for input(s): LABPT, INR in the last 72 hours.  Sensation intact distally Intact pulses distally Dorsiflexion/Plantar flexion intact Incision: dressing C/D/I  Assessment/Plan: 1 Day Post-Op Procedure(s) (LRB): LEFT TOTAL HIP ARTHROPLASTY ANTERIOR APPROACH (Left) Up with therapy Plan for discharge tomorrow Discharge home with home health  Mcarthur Rossetti 01/06/2018, 12:41 PM

## 2018-01-06 NOTE — Evaluation (Signed)
Occupational Therapy Evaluation Patient Details Name: Brian Curtis MRN: [id number] DOB: Dec 31, 1945 Today's Date: 01/06/2018    History of Present Illness Pt s/p L THR and with hx of lupus and osteoporosis   Clinical Impression   This 72 year old man was admitted for the above sx. All education was completed. NO further OT is needed at this time    Follow Up Recommendations  Supervision/Assistance - 24 hour    Equipment Recommendations  None recommended by OT    Recommendations for Other Services       Precautions / Restrictions Precautions Precautions: Fall Restrictions Other Position/Activity Restrictions: WBAT      Mobility Bed Mobility         Supine to sit: Supervision     General bed mobility comments: HOB raised  Transfers   Equipment used: Rolling walker (2 wheeled)   Sit to Stand: Min guard         General transfer comment: for safety. Cues for UE/LE placement    Balance                                           ADL either performed or assessed with clinical judgement   ADL Overall ADL's : Needs assistance/impaired     Grooming: Wash/dry hands;Oral care;Standing;Supervision/safety                   Toilet Transfer: Min guard;Ambulation;BSC;RW   Toileting- Water quality scientist and Hygiene: Min guard;Sit to/from stand   Tub/ Shower Transfer: Walk-in shower;Min guard;Ambulation;3 in 1     General ADL Comments: Pt's wife will assist with adls as needed.  He is very independent natured and walked in room to retrieve things he wanted. Stood in bathroom and used urinal.  Pt needs setup for UB adls and min to mod A for LB adls     Vision         Perception     Praxis      Pertinent Vitals/Pain Pain Score: 2  Pain Location: L hip Pain Descriptors / Indicators: Aching;Sore Pain Intervention(s): Limited activity within patient's tolerance;Monitored during session;Repositioned;Ice applied;Patient requesting  pain meds-RN notified     Hand Dominance     Extremity/Trunk Assessment Upper Extremity Assessment Upper Extremity Assessment: Overall WFL for tasks assessed       Cervical / Trunk Assessment Cervical / Trunk Assessment: Kyphotic   Communication Communication Communication: No difficulties   Cognition Arousal/Alertness: Awake/alert Behavior During Therapy: WFL for tasks assessed/performed Overall Cognitive Status: Within Functional Limits for tasks assessed                                     General Comments       Exercises     Shoulder Instructions      Home Living Family/patient expects to be discharged to:: Private residence Living Arrangements: Spouse/significant other                 Bathroom Shower/Tub: Occupational psychologist: Standard     Home Equipment: Bedside commode          Prior Functioning/Environment Level of Independence: Independent        Comments: wife had THA surgery in the past and will help as needed  OT Problem List:        OT Treatment/Interventions:      OT Goals(Current goals can be found in the care plan section) Acute Rehab OT Goals Patient Stated Goal: Regain IND OT Goal Formulation: All assessment and education complete, DC therapy  OT Frequency:     Barriers to D/C:            Co-evaluation              AM-PAC PT "6 Clicks" Daily Activity     Outcome Measure Help from another person eating meals?: None Help from another person taking care of personal grooming?: A Little Help from another person toileting, which includes using toliet, bedpan, or urinal?: A Little Help from another person bathing (including washing, rinsing, drying)?: A Little Help from another person to put on and taking off regular upper body clothing?: A Little Help from another person to put on and taking off regular lower body clothing?: A Lot 6 Click Score: 18   End of Session    Activity  Tolerance: Patient tolerated treatment well Patient left: in chair;with call bell/phone within reach  OT Visit Diagnosis: Pain Pain - Right/Left: Left Pain - part of body: Hip                Time: 8101-7510 OT Time Calculation (min): 35 min Charges:  OT General Charges $OT Visit: 1 Visit OT Evaluation $OT Eval Low Complexity: 1 Low OT Treatments $Self Care/Home Management : 8-22 mins G-Codes:     [place], OTR/L [phone] 01/06/2018  Anitra Doxtater 01/06/2018, 9:02 AM

## 2018-01-06 NOTE — Progress Notes (Signed)
Physical Therapy Treatment Patient Details Name: ANASTASIOS MELANDER MRN: [id number] DOB: 1946/05/01 Today's Date: 01/06/2018    History of Present Illness Pt s/p L THR and with hx of lupus and osteoporosis    PT Comments    POD # 1 pm session Assisted with amb a greater distance then assisted to bathroom.  Performed all standing TE's.  Pt progressing well and plans to D/C to home tomorrow.    Follow Up Recommendations  Home health PT     Equipment Recommendations  None recommended by PT    Recommendations for Other Services       Precautions / Restrictions Precautions Precautions: Fall Restrictions Other Position/Activity Restrictions: WBAT    Mobility  Bed Mobility               General bed mobility comments: OOB in recliner  Transfers Overall transfer level: Needs assistance Equipment used: Rolling walker (2 wheeled) Transfers: Sit to/from Stand Sit to Stand: Min guard         General transfer comment: for safety. Cues for UE/LE placement  Ambulation/Gait Ambulation/Gait assistance: Min assist Ambulation Distance (Feet): 115 Feet Assistive device: Rolling walker (2 wheeled) Gait Pattern/deviations: Step-to pattern;Step-through pattern;Decreased step length - right;Decreased step length - left;Shuffle;Trunk flexed Gait velocity: decr   General Gait Details: cues for posture, position from RW and initial sequence   Stairs            Wheelchair Mobility    Modified Rankin (Stroke Patients Only)       Balance                                            Cognition Arousal/Alertness: Awake/alert Behavior During Therapy: WFL for tasks assessed/performed Overall Cognitive Status: Within Functional Limits for tasks assessed                                        Exercises  10 reps all standing TE's    General Comments        Pertinent Vitals/Pain Pain Assessment: 0-10 Pain Score: 6  Pain Location: L  hip Pain Descriptors / Indicators: Aching;Sore Pain Intervention(s): Monitored during session;Repositioned;Ice applied    Home Living                      Prior Function            PT Goals (current goals can now be found in the care plan section) Progress towards PT goals: Progressing toward goals    Frequency    7X/week      PT Plan Current plan remains appropriate    Co-evaluation              AM-PAC PT "6 Clicks" Daily Activity  Outcome Measure  Difficulty turning over in bed (including adjusting bedclothes, sheets and blankets)?: Unable Difficulty moving from lying on back to sitting on the side of the bed? : Unable Difficulty sitting down on and standing up from a chair with arms (e.g., wheelchair, bedside commode, etc,.)?: Unable Help needed moving to and from a bed to chair (including a wheelchair)?: A Little Help needed walking in hospital room?: A Little Help needed climbing 3-5 steps with a railing? : A Little 6 Click Score: 12  End of Session Equipment Utilized During Treatment: Gait belt Activity Tolerance: Patient tolerated treatment well Patient left: in chair;with call bell/phone within reach;with family/visitor present;with chair alarm set Nurse Communication: Mobility status PT Visit Diagnosis: Difficulty in walking, not elsewhere classified (R26.2)     Time: 1450-1515 PT Time Calculation (min) (ACUTE ONLY): 25 min  Charges:  $Gait Training: 8-22 mins $Therapeutic Exercise: 8-22 mins                    G Codes:       Rica Koyanagi  PTA [facility]  Acute  Rehab Pager      [phone]

## 2018-01-07 MED ORDER — OXYCODONE HCL 5 MG PO TABS: 5.0000 mg | ORAL_TABLET | ORAL | 0 refills | Status: AC | PRN

## 2018-01-07 MED ORDER — ASPIRIN 81 MG PO CHEW: 81.0000 mg | CHEWABLE_TABLET | Freq: Two times a day (BID) | ORAL | 0 refills | Status: DC

## 2018-01-07 MED ORDER — METHOCARBAMOL 500 MG PO TABS: 500.0000 mg | ORAL_TABLET | Freq: Four times a day (QID) | ORAL | 0 refills | Status: DC | PRN

## 2018-01-07 NOTE — Discharge Instructions (Signed)

## 2018-01-07 NOTE — Discharge Summary (Signed)
Patient ID: Brian Curtis MRN: [id number] DOB/AGE: December 19, 1945 72 y.o.  Admit date: 01/05/2018 Discharge date: 01/07/2018  Admission Diagnoses:  Principal Problem:   Unilateral primary osteoarthritis, left hip Active Problems:   Status post total replacement of left hip   Discharge Diagnoses:  Same  Past Medical History:  Diagnosis Date  . Allergy    allergy shots every 3 weeks  . Hypertension   . Lupus    MANAGED BY DR Allyn Kenner; PER PATIENT "HE SAY I AM IN REMISSION AND IT ONLY REALLY AFFECTED THE SKIN . I SEE HIM ONCE EVERY YEAR"   . Osteoporosis    debateable per pt.     Surgeries: Procedure(s): LEFT TOTAL HIP ARTHROPLASTY ANTERIOR APPROACH on 01/05/2018   Consultants:   Discharged Condition: Improved  Hospital Course: Brian Curtis is an 72 y.o. male who was admitted 01/05/2018 for operative treatment ofUnilateral primary osteoarthritis, left hip. Patient has severe unremitting pain that affects sleep, daily activities, and work/hobbies. After pre-op clearance the patient was taken to the operating room on 01/05/2018 and underwent  Procedure(s): LEFT TOTAL HIP ARTHROPLASTY ANTERIOR APPROACH.    Patient was given perioperative antibiotics:  Anti-infectives (From admission, onward)   Start     Dose/Rate Route Frequency Ordered Stop   01/05/18 1500  ceFAZolin (ANCEF) IVPB 1 g/50 mL premix     1 g 100 mL/hr over 30 Minutes Intravenous Every 6 hours 01/05/18 1450 01/05/18 2117   01/05/18 0629  ceFAZolin (ANCEF) IVPB 2g/100 mL premix     2 g 200 mL/hr over 30 Minutes Intravenous On call to O.R. 01/05/18 2440 01/05/18 0915       Patient was given sequential compression devices, early ambulation, and chemoprophylaxis to prevent DVT.  Patient benefited maximally from hospital stay and there were no complications.    Recent vital signs:  Patient Vitals for the past 24 hrs:  BP Temp Temp src Pulse Resp SpO2  01/07/18 0650 135/71 99 F (37.2 C) Oral 78 20 98 %  01/06/18  1945 (!) 155/70 98.6 F (37 C) Oral 83 20 100 %  01/06/18 1354 131/68 98.9 F (37.2 C) Oral 74 16 100 %  01/06/18 1048 116/62 97.8 F (36.6 C) Oral 65 16 100 %     Recent laboratory studies:  Recent Labs    01/06/18 0545  WBC 6.9  HGB 10.8*  HCT 31.0*  PLT 153  NA 137  K 3.8  CL 104  CO2 28  BUN 12  CREATININE 0.69  GLUCOSE 97  CALCIUM 8.2*     Discharge Medications:   Allergies as of 01/07/2018   No Known Allergies     Medication List    TAKE these medications   aspirin 81 MG chewable tablet Chew 1 tablet (81 mg total) by mouth 2 (two) times daily.   CALCIUM-MAGNESIUM-ZINC-D3 PO Take 6 tablets by mouth 2 (two) times daily.   clobetasol cream 0.05 % Commonly known as:  TEMOVATE Apply 1 application topically daily as needed (for red blotches).   clobetasol 0.05 % external solution Commonly known as:  TEMOVATE Apply 1 application topically daily as needed (for red blotches).   Cod Liver Oil 1250-130 units Caps Take 3 capsules by mouth daily.   lisinopril 10 MG tablet Commonly known as:  PRINIVIL,ZESTRIL Take 10 mg by mouth every evening.   methocarbamol 500 MG tablet Commonly known as:  ROBAXIN Take 1 tablet (500 mg total) by mouth every 6 (six) hours as needed for  muscle spasms.   MULTIVITAMIN ADULTS 50+ PO Take 1 tablet by mouth daily.   NONFORMULARY OR COMPOUNDED ITEM Inject 1 each as directed every 21 ( twenty-one) days. ALLERGEN IMMUNOTHERAPY   oxyCODONE 5 MG immediate release tablet Commonly known as:  ROXICODONE Take 1-2 tablets (5-10 mg total) by mouth every 4 (four) hours as needed.   PROBIOTIC DAILY PO Take 1 capsule by mouth daily.            Durable Medical Equipment  (From admission, onward)        Start     Ordered   01/05/18 1451  DME 3 n 1  Once     01/05/18 1450   01/05/18 1451  DME Walker rolling  Once    Question:  Patient needs a walker to treat with the following condition  Answer:  Status post total replacement  of left hip   01/05/18 1450      Diagnostic Studies: Dg Pelvis Portable  Result Date: 01/05/2018 CLINICAL DATA:  Left anterior hip replacement EXAM: PORTABLE PELVIS 1-2 VIEWS COMPARISON:  None. FINDINGS: Interval total left hip arthroplasty without failure complication. Postsurgical changes in the surrounding soft tissues. No acute fracture or dislocation. IMPRESSION: Interval left total hip arthroplasty. Electronically Signed   By: Kathreen Devoid   On: 01/05/2018 11:12   Dg C-arm 1-60 Min-no Report  Result Date: 01/05/2018 Fluoroscopy was utilized by the requesting physician.  No radiographic interpretation.   Dg Hip Operative Unilat W Or W/o Pelvis Left  Result Date: 01/05/2018 CLINICAL DATA:  Intraoperative left hip replacement; fluoro time 56.9 sec; dose 2.6 mGy EXAM: OPERATIVE LEFT HIP (WITH PELVIS IF PERFORMED) 6 VIEWS TECHNIQUE: Fluoroscopic spot image(s) were submitted for interpretation post-operatively. COMPARISON:  09/04/2017 FINDINGS: Submitted images show a total left hip arthroplasty, with the acetabular and femoral components well seated and well aligned. There is no acute fracture or evidence of an operative complication. IMPRESSION: Well-positioned left hip total arthroplasty. Electronically Signed   By: Lajean Manes M.D.   On: 01/05/2018 10:33    Disposition: to home  Discharge Instructions    Discharge patient   Complete by:  As directed    Discharge to home this afternoon.   Discharge disposition:  01-Home or Self Care   Discharge patient date:  01/07/2018      Follow-up Information    Mcarthur Rossetti, MD Follow up in 2 week(s).   Specialty:  Orthopedic Surgery Contact information: [place] [address] [phone]            Signed: Mcarthur Rossetti 01/07/2018, 9:27 AM

## 2018-01-07 NOTE — Care Management Note (Signed)
Case Management Note  Patient Details  Name: Brian Curtis MRN: [id number] Date of Birth: 06-Jul-1946  Subjective/Objective:  Left THA                 Action/Plan: Spoke to pt and offered choice for HH/list provided. Pt agreeable to [facility]. Pt requesting RW and 3n1 bc for home. Contacted [facility] for DME to be delivered to room prior to dc.   Expected Discharge Date:  01/07/18               Expected Discharge Plan:  [place]  In-House Referral:  NA  Discharge planning Services  CM Consult  Post Acute Care Choice:  Home Health Choice offered to:  Patient  DME Arranged:  3-N-1, Walker rolling DME Agency:  [place]:  PT [place] Agency:  [facility] (formerly [facility]  Status of Service:  Completed, signed off  If discussed at [employer], dates discussed:    Additional Comments:  Erenest Rasher, RN 01/07/2018, 2:13 PM

## 2018-01-07 NOTE — Progress Notes (Signed)
Subjective: 2 Days Post-Op Procedure(s) (LRB): LEFT TOTAL HIP ARTHROPLASTY ANTERIOR APPROACH (Left) Patient reports pain as moderate.    Objective: Vital signs in last 24 hours: Temp:  [97.8 F (36.6 C)-99 F (37.2 C)] 99 F (37.2 C) (02/10 0650) Pulse Rate:  [65-83] 78 (02/10 0650) Resp:  [16-20] 20 (02/10 0650) BP: (116-155)/(62-71) 135/71 (02/10 0650) SpO2:  [98 %-100 %] 98 % (02/10 0650)  Intake/Output from previous day: 02/09 0701 - 02/10 0700 In: 600 [P.O.:600] Out: 975 [Urine:975] Intake/Output this shift: Total I/O In: 240 [P.O.:240] Out: -   Recent Labs    01/06/18 0545  HGB 10.8*   Recent Labs    01/06/18 0545  WBC 6.9  RBC 3.41*  HCT 31.0*  PLT 153   Recent Labs    01/06/18 0545  NA 137  K 3.8  CL 104  CO2 28  BUN 12  CREATININE 0.69  GLUCOSE 97  CALCIUM 8.2*   No results for input(s): LABPT, INR in the last 72 hours.  Sensation intact distally Intact pulses distally Dorsiflexion/Plantar flexion intact Incision: dressing C/D/I  Assessment/Plan: 2 Days Post-Op Procedure(s) (LRB): LEFT TOTAL HIP ARTHROPLASTY ANTERIOR APPROACH (Left) Up with therapy Discharge home with home health this afternoon.  Mcarthur Rossetti 01/07/2018, 9:24 AM

## 2018-01-07 NOTE — Progress Notes (Signed)
Physical Therapy Treatment Patient Details Name: Brian Curtis MRN: [id number] DOB: 15-May-1946 Today's Date: 01/07/2018    History of Present Illness Pt s/p L THR and with hx of lupus and osteoporosis    PT Comments    Pt progressing well; feels comfortable with stairs/amb/HEP; ready for d/c from PT standpoint   Follow Up Recommendations  Home health PT     Equipment Recommendations  Rolling walker with 5" wheels(pt wants RW)    Recommendations for Other Services       Precautions / Restrictions Precautions Precautions: Fall Restrictions Weight Bearing Restrictions: No Other Position/Activity Restrictions: WBAT    Mobility  Bed Mobility                  Transfers Overall transfer level: Needs assistance Equipment used: Rolling walker (2 wheeled) Transfers: Sit to/from Stand Sit to Stand: Supervision         General transfer comment: for safety. Cues for UE/LE placement  Ambulation/Gait Ambulation/Gait assistance: Supervision;Min guard Ambulation Distance (Feet): 150 Feet Assistive device: Rolling walker (2 wheeled) Gait Pattern/deviations: Step-to pattern;Step-through pattern;Trunk flexed Gait velocity: decr   General Gait Details: cues for gait progression   Stairs Stairs: Yes   Stair Management: One rail Left;Forwards;Step to pattern;With cane Number of Stairs: 5 General stair comments: cues for sequence and technique  Wheelchair Mobility    Modified Rankin (Stroke Patients Only)       Balance                                            Cognition Arousal/Alertness: Awake/alert Behavior During Therapy: WFL for tasks assessed/performed Overall Cognitive Status: Within Functional Limits for tasks assessed                                        Exercises Total Joint Exercises Ankle Circles/Pumps: AROM;Both;15 reps;Supine Hip ABduction/ADduction: AROM;Left;10 reps;Standing Knee Flexion:  AROM;Left;10 reps;Standing Marching in Standing: AROM;Left;10 reps;Standing Standing Hip Extension: AROM;Left;10 reps;Standing    General Comments        Pertinent Vitals/Pain Pain Assessment: 0-10 Pain Score: 3  Pain Location: L hip Pain Descriptors / Indicators: Aching;Sore Pain Intervention(s): Limited activity within patient's tolerance;Monitored during session;Premedicated before session    Home Living                      Prior Function            PT Goals (current goals can now be found in the care plan section) Acute Rehab PT Goals Patient Stated Goal: Regain IND PT Goal Formulation: With patient Time For Goal Achievement: 01/12/18 Potential to Achieve Goals: Good Progress towards PT goals: Progressing toward goals    Frequency    7X/week      PT Plan Current plan remains appropriate    Co-evaluation              AM-PAC PT "6 Clicks" Daily Activity  Outcome Measure  Difficulty turning over in bed (including adjusting bedclothes, sheets and blankets)?: Unable Difficulty moving from lying on back to sitting on the side of the bed? : Unable Difficulty sitting down on and standing up from a chair with arms (e.g., wheelchair, bedside commode, etc,.)?: A Little Help needed moving to and from a bed  to chair (including a wheelchair)?: A Little Help needed walking in hospital room?: A Little Help needed climbing 3-5 steps with a railing? : A Little 6 Click Score: 14    End of Session Equipment Utilized During Treatment: Gait belt Activity Tolerance: Patient tolerated treatment well Patient left: in chair;with call bell/phone within reach   PT Visit Diagnosis: Difficulty in walking, not elsewhere classified (R26.2)     Time: 3744-5146 PT Time Calculation (min) (ACUTE ONLY): 20 min  Charges:  $Gait Training: 8-22 mins                    G CodesKenyon Curtis, PT Pager: [phone] 01/07/2018    [facility] 01/07/2018, 11:47  AM

## 2018-01-08 ENCOUNTER — Encounter (HOSPITAL_COMMUNITY): Payer: Self-pay | Admitting: Orthopaedic Surgery

## 2018-01-08 NOTE — Op Note (Signed)
NAME:  Brian Curtis, Brian Curtis NO.:  MEDICAL RECORD NO.:  [id number]  LOCATION:                                 FACILITY:  PHYSICIAN:  Lind Guest. 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  PROCEDURE:  Left total hip arthroplasty through direct anterior approach.  IMPLANTS:  DePuy Sector Gription acetabular component size 56 with a single screw, size 36+ 0 neutral polyethylene liner, size 13 Corail femoral component with varus offset, size 36+ 5 ceramic hip ball.  SURGEON:  Lind Guest. Ninfa Linden, M.D.  ASSISTANT:  Erskine Emery, PA-C.  ANESTHESIA:  Spinal.  ANTIBIOTICS:  2 g of IV Ancef.  BLOOD LOSS:  Less than 300 cc.  COMPLICATIONS:  None.  INDICATIONS:  Mr. Tippin is a very pleasant 72 year old gentleman with debilitating hip pain involving his left hip.  He has known severe osteoarthritis and degenerative joint disease with flattening of the femoral head and complete loss of the joint space.  He has tried and failed all forms of conservative treatment.  At this point, he does wish to proceed with a total hip arthroplasty.  He understands the risks of acute blood loss anemia, nerve and vessel injury, fracture, infection, dislocation, DVT.  He understands our goals are to decrease pain, improve mobility, and overall improved quality of life.  PROCEDURE DESCRIPTION:  After informed consent was obtained, appropriate left hip was marked.  He was brought to the operating room where spinal anesthesia was obtained while he was on the stretcher.  He was then laid in a supine position on the stretcher.  A Foley catheter was placed and both feet had traction boots applied to them.   Next, he was placed supine on the Hana fracture table with the perineal post in place and both legs in inline skeletal traction devices, but no traction applied. Of note, preoperatively, he is shorter on his operative left side than the right side.  His left hip was prepped and draped with DuraPrep and sterile drapes.  A time-out was called and he was identified as correct patient and correct left hip.  We then made an incision just inferior and posterior to the anterior superior iliac spine and carried this obliquely down the leg.  We dissected down the tensor fascia lata muscle.  The tensor fascia was then divided longitudinally to proceed with a direct anterior approach to the hip.  We identified and cauterized the circumflex vessels and then identified the hip capsule. We opened up the hip capsule in an L-type format, finding a very large joint effusion and significant synovitis around his left hip joint.  I placed Cobra retractors around the medial and lateral femoral neck and then made our femoral neck cut with an oscillating saw and completed this with an osteotome.  We placed a corkscrew guide in the femoral head and removed the femoral head in its entirety and found it to  be completely devoid of cartilage with flattening as well.  I placed a bent Hohmann over the medial acetabular rim and removed remnants of the acetabular labrum and other periarticular osteophytes around the acetabular rim.  We then began reaming under direct visualization from a size 43 reamer going in stepwise increments up to a size 56 with all reamers under direct visualization and the last two reamers under direct fluoroscopy, so we could obtain our depth of reaming, our inclination and anteversion.  Once I was pleased with this, we placed the real DePuy Sector Gription acetabular component size 56 and a single screw.  We then placed a 36+ 0 polyethylene liner for that size acetabular component.  Attention  was then turned to the femur.  With the leg externally rotated to 120 degrees, extended and adducted, we were able to place a Mueller retractor medially and a Hohmann retractor behind the greater trochanter.  We released the lateral joint capsule and used a box cutting osteotome to enter the femoral canal and a rongeur to lateralize.  We then began broaching from a size 8 broach using the Corail broaching system going up to a size 13.  With the 13 in place, we trialed a standard offset femoral neck and a 36+ 1.5 hip ball.  We brought the leg back over and up and reduced in the pelvis, and we felt that it was stable and leg lengths were near equal.  We dislocated the hip and removed the trial components.  We then placed the real Corail femoral component size 13 with varus offset and the real 36+ 1.5 hip ball, and reduced this in the acetabulum, but I felt that he was now just a touch shorter and had more Shuck than I like, so I dislocated the hip and removed that hip ball and placed 36+ 5 ceramic hip ball.  We reduced this in the acetabulum and then I really felt good about the offset, leg lengths and stability with range of motion.  This was all verified under clinical exam and fluoroscopy.  We then irrigated the hip soft tissue with normal saline solution using pulsatile lavage.  We closed the joint capsule with interrupted #1 Ethibond suture followed by running #1 Vicryl in the tensor fascia, 0 Vicryl in the deep tissue, 2-0 Vicryl in the subcutaneous tissue, 4-0 Monocryl subcuticular stitch and Steri-Strips on the skin.  An Aquacel dressing was applied.  He was taken off the Hana table and taken to the recovery room in stable condition.  All final counts were correct.  There were no complications noted.  Of note, Erskine Emery, PA-C, assisted in the entire case.  His assistance was crucial for facilitating all aspects of this case.     Lind Guest. Ninfa Linden, M.D.     CYB/MEDQ   D:  01/05/2018  T:  01/06/2018  Job:  [id number]

## 2018-01-09 ENCOUNTER — Telehealth (INDEPENDENT_AMBULATORY_CARE_PROVIDER_SITE_OTHER): Payer: Self-pay | Admitting: Orthopaedic Surgery

## 2018-01-09 NOTE — Telephone Encounter (Signed)
Brian Curtis from [place] [facility] called asking for verbal approval on 3 week 1 and 2 week 1. CB # [phone]

## 2018-01-09 NOTE — Telephone Encounter (Signed)
Verbal order given  

## 2018-01-18 ENCOUNTER — Encounter (INDEPENDENT_AMBULATORY_CARE_PROVIDER_SITE_OTHER): Payer: Self-pay | Admitting: Orthopaedic Surgery

## 2018-01-18 ENCOUNTER — Ambulatory Visit (INDEPENDENT_AMBULATORY_CARE_PROVIDER_SITE_OTHER): Payer: Medicare Other | Admitting: Orthopaedic Surgery

## 2018-01-18 DIAGNOSIS — Z96642 Presence of left artificial hip joint: Secondary | ICD-10-CM

## 2018-01-18 NOTE — Progress Notes (Signed)
The patient is 2 weeks tomorrow status post a left total hip arthroplasty.  He says he is doing great.  On exam his incision looks good.  Her place new Steri-Strips.  He does have a slight seroma but we did not need to drain it.  His leg lengths are equal.  He is already walking without assistive device as well.  A long and lengthy discussion about his hip and his activities.  He can stop his aspirin.  We will see him back in a month to see how he is doing overall but no x-rays are needed.  All questions concerns were answered and addressed.

## 2018-01-19 ENCOUNTER — Ambulatory Visit (INDEPENDENT_AMBULATORY_CARE_PROVIDER_SITE_OTHER): Payer: Medicare Other | Admitting: *Deleted

## 2018-01-19 DIAGNOSIS — J309 Allergic rhinitis, unspecified: Secondary | ICD-10-CM

## 2018-02-13 ENCOUNTER — Ambulatory Visit (INDEPENDENT_AMBULATORY_CARE_PROVIDER_SITE_OTHER): Payer: Medicare Other | Admitting: *Deleted

## 2018-02-13 DIAGNOSIS — J309 Allergic rhinitis, unspecified: Secondary | ICD-10-CM | POA: Diagnosis not present

## 2018-02-19 ENCOUNTER — Ambulatory Visit (INDEPENDENT_AMBULATORY_CARE_PROVIDER_SITE_OTHER): Payer: Medicare Other | Admitting: Orthopaedic Surgery

## 2018-02-19 ENCOUNTER — Encounter (INDEPENDENT_AMBULATORY_CARE_PROVIDER_SITE_OTHER): Payer: Self-pay | Admitting: Orthopaedic Surgery

## 2018-02-19 DIAGNOSIS — Z96642 Presence of left artificial hip joint: Secondary | ICD-10-CM

## 2018-02-19 NOTE — Progress Notes (Signed)
The patient is now 6 weeks status post a left total hip arthroplasty.  He is doing well overall.  On exam he has some patellofemoral crepitation of his left knee and that this does bother him going up and down stairs but his hip seems to be doing well with rotation.  There is only some slight stiffness.  His leg lengths are equal.  He is walking without assistive device.  There is no significant limp.  At this point we do not need to see him back for 6 months.  All questions concerns were answered and addressed.  At that visit at 6 months I would like a low AP pelvis and a lateral of his left operative hip.

## 2018-02-23 ENCOUNTER — Ambulatory Visit (INDEPENDENT_AMBULATORY_CARE_PROVIDER_SITE_OTHER): Payer: Medicare Other

## 2018-02-23 DIAGNOSIS — J309 Allergic rhinitis, unspecified: Secondary | ICD-10-CM | POA: Diagnosis not present

## 2018-02-28 ENCOUNTER — Ambulatory Visit (INDEPENDENT_AMBULATORY_CARE_PROVIDER_SITE_OTHER): Payer: Medicare Other

## 2018-02-28 DIAGNOSIS — J309 Allergic rhinitis, unspecified: Secondary | ICD-10-CM

## 2018-03-07 ENCOUNTER — Ambulatory Visit (INDEPENDENT_AMBULATORY_CARE_PROVIDER_SITE_OTHER): Payer: Medicare Other | Admitting: *Deleted

## 2018-03-07 DIAGNOSIS — J309 Allergic rhinitis, unspecified: Secondary | ICD-10-CM | POA: Diagnosis not present

## 2018-03-13 ENCOUNTER — Ambulatory Visit (INDEPENDENT_AMBULATORY_CARE_PROVIDER_SITE_OTHER): Payer: Medicare Other

## 2018-03-13 DIAGNOSIS — J309 Allergic rhinitis, unspecified: Secondary | ICD-10-CM | POA: Diagnosis not present

## 2018-04-04 ENCOUNTER — Ambulatory Visit (INDEPENDENT_AMBULATORY_CARE_PROVIDER_SITE_OTHER): Payer: Medicare Other

## 2018-04-04 DIAGNOSIS — J309 Allergic rhinitis, unspecified: Secondary | ICD-10-CM | POA: Diagnosis not present

## 2018-04-17 ENCOUNTER — Ambulatory Visit (INDEPENDENT_AMBULATORY_CARE_PROVIDER_SITE_OTHER): Payer: Medicare Other

## 2018-04-17 DIAGNOSIS — J309 Allergic rhinitis, unspecified: Secondary | ICD-10-CM | POA: Diagnosis not present

## 2018-05-08 NOTE — Progress Notes (Signed)
VIAL EXP 05-10-19

## 2018-05-09 DIAGNOSIS — J301 Allergic rhinitis due to pollen: Secondary | ICD-10-CM

## 2018-05-10 ENCOUNTER — Ambulatory Visit (INDEPENDENT_AMBULATORY_CARE_PROVIDER_SITE_OTHER): Payer: Medicare Other

## 2018-05-10 DIAGNOSIS — J309 Allergic rhinitis, unspecified: Secondary | ICD-10-CM | POA: Diagnosis not present

## 2018-06-05 ENCOUNTER — Ambulatory Visit (INDEPENDENT_AMBULATORY_CARE_PROVIDER_SITE_OTHER): Payer: Medicare Other

## 2018-06-05 DIAGNOSIS — J309 Allergic rhinitis, unspecified: Secondary | ICD-10-CM | POA: Diagnosis not present

## 2018-06-20 ENCOUNTER — Ambulatory Visit (INDEPENDENT_AMBULATORY_CARE_PROVIDER_SITE_OTHER): Payer: Medicare Other

## 2018-06-20 DIAGNOSIS — J309 Allergic rhinitis, unspecified: Secondary | ICD-10-CM | POA: Diagnosis not present

## 2018-07-10 ENCOUNTER — Ambulatory Visit (INDEPENDENT_AMBULATORY_CARE_PROVIDER_SITE_OTHER): Payer: Medicare Other

## 2018-07-10 DIAGNOSIS — J309 Allergic rhinitis, unspecified: Secondary | ICD-10-CM

## 2018-07-18 ENCOUNTER — Ambulatory Visit (INDEPENDENT_AMBULATORY_CARE_PROVIDER_SITE_OTHER): Payer: Medicare Other | Admitting: *Deleted

## 2018-07-18 DIAGNOSIS — J309 Allergic rhinitis, unspecified: Secondary | ICD-10-CM

## 2018-08-03 ENCOUNTER — Ambulatory Visit (INDEPENDENT_AMBULATORY_CARE_PROVIDER_SITE_OTHER): Payer: Medicare Other

## 2018-08-03 DIAGNOSIS — J309 Allergic rhinitis, unspecified: Secondary | ICD-10-CM | POA: Diagnosis not present

## 2018-08-08 ENCOUNTER — Ambulatory Visit (INDEPENDENT_AMBULATORY_CARE_PROVIDER_SITE_OTHER): Payer: Medicare Other | Admitting: *Deleted

## 2018-08-08 DIAGNOSIS — J309 Allergic rhinitis, unspecified: Secondary | ICD-10-CM

## 2018-08-13 ENCOUNTER — Ambulatory Visit (INDEPENDENT_AMBULATORY_CARE_PROVIDER_SITE_OTHER): Payer: Medicare Other

## 2018-08-13 DIAGNOSIS — J309 Allergic rhinitis, unspecified: Secondary | ICD-10-CM

## 2018-08-22 ENCOUNTER — Ambulatory Visit (INDEPENDENT_AMBULATORY_CARE_PROVIDER_SITE_OTHER): Payer: Medicare Other | Admitting: Orthopaedic Surgery

## 2018-08-22 ENCOUNTER — Ambulatory Visit (INDEPENDENT_AMBULATORY_CARE_PROVIDER_SITE_OTHER): Payer: Medicare Other

## 2018-08-22 DIAGNOSIS — Z96642 Presence of left artificial hip joint: Secondary | ICD-10-CM | POA: Diagnosis not present

## 2018-08-22 NOTE — Progress Notes (Signed)
The patient is very active 72 year old gentleman 6 months status post a left total hip arthroplasty.  He said he is doing well overall but still does not feel like his got strength in terms of getting off the ground and getting up and going when he is been down on the ground.  He is not interested in any type of physical therapy though.  He has no complaints of pain at all.  On exam I can easily put his left hip the range of motion with no difficulties at all.  He seems to have good strength in his hip flexors and quads.  An AP pelvis is standing shows a well-seated implant with no complicating features.  There is new heterotopic bone that has developed just posterior to the hip capsule.  At this point he will continue to increase his activities as comfort allows.  Again offered him physical therapy but he says he can do without.  I would like to see him back in 6 months.  At that visit I would like an AP pelvis but supine and not standing.

## 2018-09-05 ENCOUNTER — Ambulatory Visit (INDEPENDENT_AMBULATORY_CARE_PROVIDER_SITE_OTHER): Payer: Medicare Other

## 2018-09-05 DIAGNOSIS — J309 Allergic rhinitis, unspecified: Secondary | ICD-10-CM | POA: Diagnosis not present

## 2018-09-17 NOTE — Progress Notes (Signed)
EXP 09/18/19 

## 2018-09-19 DIAGNOSIS — J3089 Other allergic rhinitis: Secondary | ICD-10-CM | POA: Diagnosis not present

## 2018-09-24 ENCOUNTER — Ambulatory Visit (INDEPENDENT_AMBULATORY_CARE_PROVIDER_SITE_OTHER): Payer: Medicare Other

## 2018-09-24 DIAGNOSIS — J309 Allergic rhinitis, unspecified: Secondary | ICD-10-CM | POA: Diagnosis not present

## 2018-10-17 ENCOUNTER — Ambulatory Visit (INDEPENDENT_AMBULATORY_CARE_PROVIDER_SITE_OTHER): Payer: Medicare Other | Admitting: *Deleted

## 2018-10-17 DIAGNOSIS — J309 Allergic rhinitis, unspecified: Secondary | ICD-10-CM

## 2018-11-12 ENCOUNTER — Ambulatory Visit (INDEPENDENT_AMBULATORY_CARE_PROVIDER_SITE_OTHER): Payer: Medicare Other

## 2018-11-12 DIAGNOSIS — J309 Allergic rhinitis, unspecified: Secondary | ICD-10-CM

## 2018-12-03 ENCOUNTER — Ambulatory Visit (INDEPENDENT_AMBULATORY_CARE_PROVIDER_SITE_OTHER): Payer: Medicare Other

## 2018-12-03 DIAGNOSIS — J309 Allergic rhinitis, unspecified: Secondary | ICD-10-CM

## 2018-12-26 ENCOUNTER — Ambulatory Visit (INDEPENDENT_AMBULATORY_CARE_PROVIDER_SITE_OTHER): Payer: Medicare Other

## 2018-12-26 DIAGNOSIS — J309 Allergic rhinitis, unspecified: Secondary | ICD-10-CM | POA: Diagnosis not present

## 2019-01-15 ENCOUNTER — Ambulatory Visit (INDEPENDENT_AMBULATORY_CARE_PROVIDER_SITE_OTHER): Payer: Medicare Other | Admitting: Pediatrics

## 2019-01-15 ENCOUNTER — Encounter: Payer: Self-pay | Admitting: Pediatrics

## 2019-01-15 VITALS — BP 114/70 | HR 59 | Temp 98.2°F | Resp 16 | Ht 68.0 in | Wt 151.8 lb

## 2019-01-15 DIAGNOSIS — I10 Essential (primary) hypertension: Secondary | ICD-10-CM

## 2019-01-15 DIAGNOSIS — J309 Allergic rhinitis, unspecified: Secondary | ICD-10-CM

## 2019-01-15 NOTE — Progress Notes (Signed)
  [place] [address] Dept: [phone]  FOLLOW UP NOTE  Patient ID: Brian Curtis, male    DOB: 04/21/1946  Age: 73 y.o. MRN: [id number] Date of Office Visit: 01/15/2019  Assessment  Chief Complaint: Follow-up (Refills)  HPI [name] presents for follow-up of  allergic rhinitis.  He is on allergy injections every 3 weeks and has had an excellent response.  He would like to continue on allergy injections.  He is not having to take antihistamines on a regular basis   Drug Allergies:  No Known Allergies  Physical Exam: BP 114/70 (BP Location: Right Arm, Patient Position: Sitting, Cuff Size: Normal)   Pulse (!) 59   Temp 98.2 F (36.8 C) (Oral)   Resp 16   Ht 5\' 8"  (1.727 m)   Wt 151 lb 12.8 oz (68.9 kg)   SpO2 99%   BMI 23.08 kg/m    Physical Exam Constitutional:      Appearance: Normal appearance. He is normal weight.  HENT:     Head:     Comments: Eyes normal.  Ears normal.  Nose normal.  Pharynx normal. Neck:     Musculoskeletal: Neck supple.  Cardiovascular:     Comments: S1-S2 normal no murmurs Pulmonary:     Comments: Clear to percussion and auscultation Lymphadenopathy:     Cervical: No cervical adenopathy.  Neurological:     General: No focal deficit present.     Mental Status: He is alert and oriented to person, place, and time.  Psychiatric:        Mood and Affect: Mood normal.        Behavior: Behavior normal.        Thought Content: Thought content normal.        Judgment: Judgment normal.     Diagnostics:    Assessment and Plan: 1. Essential hypertension   2. Allergic rhinitis, unspecified seasonality, unspecified trigger     No orders of the defined types were placed in this encounter.   Patient Instructions  Continue on allergy injections every 3 weeks Nasal saline irrigations at night Continue on your other medications Loratadine 10 mg once a day if needed for runny nose Fluticasone 2 sprays per nostril  once a day if needed for stuffy nose If he gets a sinus infection when he travels, he may use amoxicillin 500 mg 3 times a day for 10 days   Return in about 1 year (around 01/16/2020).    Thank you for the opportunity to care for this patient.  Please do not hesitate to contact me with questions.  Penne Lash, M.D.  [facility] [address] [place], [place] [address] [phone]

## 2019-01-15 NOTE — Patient Instructions (Addendum)
Continue on allergy injections every 3 weeks Nasal saline irrigations at night Continue on your other medications Loratadine 10 mg once a day if needed for runny nose Fluticasone 2 sprays per nostril once a day if needed for stuffy nose If he gets a sinus infection when he travels, he may use amoxicillin 500 mg 3 times a day for 10 days

## 2019-01-24 ENCOUNTER — Ambulatory Visit (INDEPENDENT_AMBULATORY_CARE_PROVIDER_SITE_OTHER): Payer: Medicare Other

## 2019-01-24 DIAGNOSIS — J309 Allergic rhinitis, unspecified: Secondary | ICD-10-CM | POA: Diagnosis not present

## 2019-01-30 ENCOUNTER — Ambulatory Visit (INDEPENDENT_AMBULATORY_CARE_PROVIDER_SITE_OTHER): Payer: Medicare Other

## 2019-01-30 DIAGNOSIS — J309 Allergic rhinitis, unspecified: Secondary | ICD-10-CM

## 2019-02-05 ENCOUNTER — Ambulatory Visit (INDEPENDENT_AMBULATORY_CARE_PROVIDER_SITE_OTHER): Payer: Medicare Other

## 2019-02-05 DIAGNOSIS — J309 Allergic rhinitis, unspecified: Secondary | ICD-10-CM

## 2019-02-11 ENCOUNTER — Ambulatory Visit (INDEPENDENT_AMBULATORY_CARE_PROVIDER_SITE_OTHER): Payer: Medicare Other

## 2019-02-11 DIAGNOSIS — J309 Allergic rhinitis, unspecified: Secondary | ICD-10-CM | POA: Diagnosis not present

## 2019-02-11 IMAGING — DX DG PORTABLE PELVIS
1 series · 1 of 1 positions shown · non-contrast
Comparison: None.

CLINICAL DATA: Left anterior hip replacement

EXAM:
PORTABLE PELVIS 1-2 VIEWS

[pelvis ap]
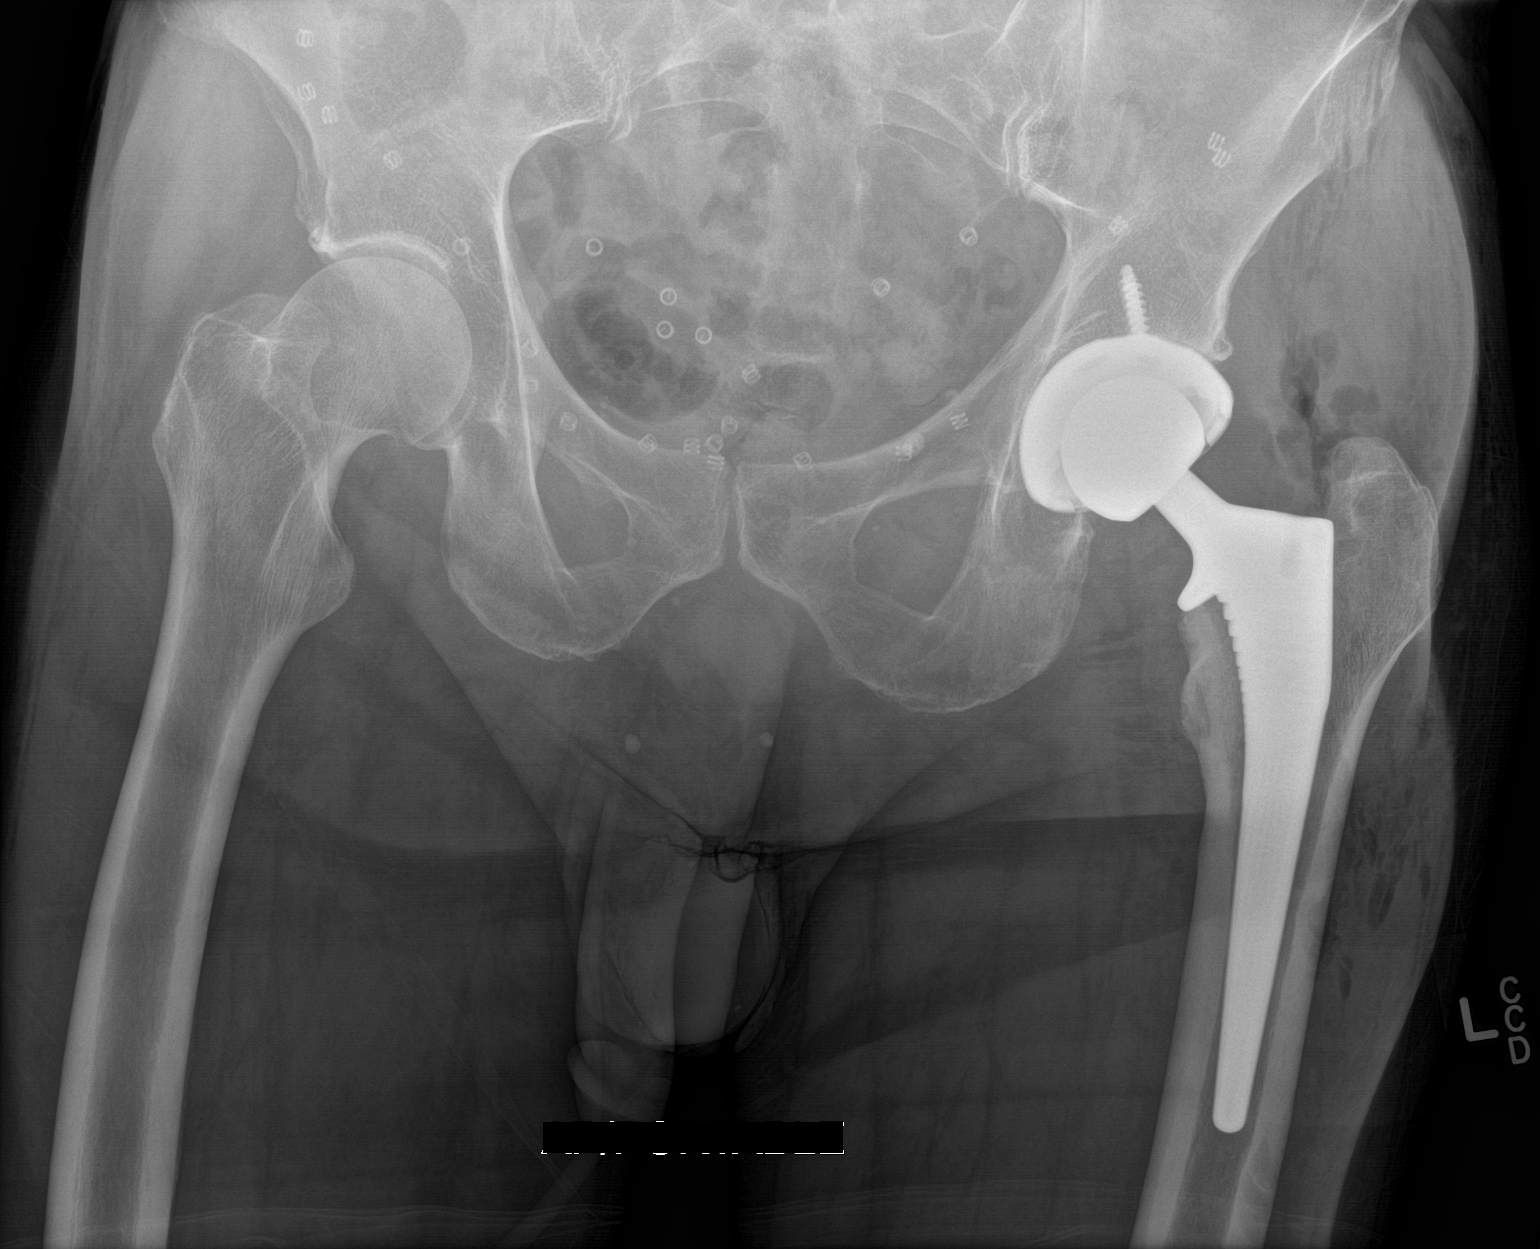

[1 of 1 positions shown; findings below may reference images not displayed]

FINDINGS: Interval total left hip arthroplasty without failure complication.
Postsurgical changes in the surrounding soft tissues.

No acute fracture or dislocation.
IMPRESSION: Interval left total hip arthroplasty.

## 2019-02-20 ENCOUNTER — Ambulatory Visit (INDEPENDENT_AMBULATORY_CARE_PROVIDER_SITE_OTHER): Payer: Medicare Other | Admitting: Orthopaedic Surgery

## 2019-02-26 ENCOUNTER — Ambulatory Visit (INDEPENDENT_AMBULATORY_CARE_PROVIDER_SITE_OTHER): Payer: Medicare Other

## 2019-02-26 DIAGNOSIS — J309 Allergic rhinitis, unspecified: Secondary | ICD-10-CM | POA: Diagnosis not present

## 2019-03-14 NOTE — Progress Notes (Signed)
EXP 03/13/20 

## 2019-03-18 DIAGNOSIS — J301 Allergic rhinitis due to pollen: Secondary | ICD-10-CM

## 2019-03-19 ENCOUNTER — Ambulatory Visit (INDEPENDENT_AMBULATORY_CARE_PROVIDER_SITE_OTHER): Payer: Medicare Other

## 2019-03-19 DIAGNOSIS — J309 Allergic rhinitis, unspecified: Secondary | ICD-10-CM | POA: Diagnosis not present

## 2019-04-10 ENCOUNTER — Ambulatory Visit (INDEPENDENT_AMBULATORY_CARE_PROVIDER_SITE_OTHER): Payer: Medicare Other

## 2019-04-10 DIAGNOSIS — J309 Allergic rhinitis, unspecified: Secondary | ICD-10-CM

## 2019-05-06 ENCOUNTER — Ambulatory Visit (INDEPENDENT_AMBULATORY_CARE_PROVIDER_SITE_OTHER): Payer: Medicare Other

## 2019-05-06 DIAGNOSIS — J309 Allergic rhinitis, unspecified: Secondary | ICD-10-CM | POA: Diagnosis not present

## 2019-05-27 ENCOUNTER — Ambulatory Visit (INDEPENDENT_AMBULATORY_CARE_PROVIDER_SITE_OTHER): Payer: Medicare Other

## 2019-05-27 DIAGNOSIS — J309 Allergic rhinitis, unspecified: Secondary | ICD-10-CM

## 2019-06-17 ENCOUNTER — Ambulatory Visit (INDEPENDENT_AMBULATORY_CARE_PROVIDER_SITE_OTHER): Payer: Medicare Other

## 2019-06-17 DIAGNOSIS — J309 Allergic rhinitis, unspecified: Secondary | ICD-10-CM

## 2019-07-08 ENCOUNTER — Ambulatory Visit (INDEPENDENT_AMBULATORY_CARE_PROVIDER_SITE_OTHER): Payer: Medicare Other

## 2019-07-08 DIAGNOSIS — J309 Allergic rhinitis, unspecified: Secondary | ICD-10-CM | POA: Diagnosis not present

## 2019-07-30 ENCOUNTER — Ambulatory Visit (INDEPENDENT_AMBULATORY_CARE_PROVIDER_SITE_OTHER): Payer: Medicare Other

## 2019-07-30 DIAGNOSIS — J309 Allergic rhinitis, unspecified: Secondary | ICD-10-CM

## 2019-08-08 ENCOUNTER — Ambulatory Visit (INDEPENDENT_AMBULATORY_CARE_PROVIDER_SITE_OTHER): Payer: Medicare Other

## 2019-08-08 DIAGNOSIS — J309 Allergic rhinitis, unspecified: Secondary | ICD-10-CM | POA: Diagnosis not present

## 2019-08-20 ENCOUNTER — Ambulatory Visit (INDEPENDENT_AMBULATORY_CARE_PROVIDER_SITE_OTHER): Payer: Medicare Other

## 2019-08-20 DIAGNOSIS — J309 Allergic rhinitis, unspecified: Secondary | ICD-10-CM | POA: Diagnosis not present

## 2019-09-02 ENCOUNTER — Ambulatory Visit (INDEPENDENT_AMBULATORY_CARE_PROVIDER_SITE_OTHER): Payer: Medicare Other

## 2019-09-02 DIAGNOSIS — J309 Allergic rhinitis, unspecified: Secondary | ICD-10-CM

## 2019-09-24 ENCOUNTER — Ambulatory Visit (INDEPENDENT_AMBULATORY_CARE_PROVIDER_SITE_OTHER): Payer: Medicare Other

## 2019-09-24 DIAGNOSIS — J309 Allergic rhinitis, unspecified: Secondary | ICD-10-CM

## 2019-10-14 ENCOUNTER — Ambulatory Visit (INDEPENDENT_AMBULATORY_CARE_PROVIDER_SITE_OTHER): Payer: Medicare Other

## 2019-10-14 DIAGNOSIS — J309 Allergic rhinitis, unspecified: Secondary | ICD-10-CM

## 2019-10-28 ENCOUNTER — Encounter: Payer: Self-pay | Admitting: Gastroenterology

## 2019-10-29 ENCOUNTER — Ambulatory Visit (INDEPENDENT_AMBULATORY_CARE_PROVIDER_SITE_OTHER): Payer: Medicare Other

## 2019-10-29 DIAGNOSIS — J309 Allergic rhinitis, unspecified: Secondary | ICD-10-CM

## 2019-11-14 DIAGNOSIS — J301 Allergic rhinitis due to pollen: Secondary | ICD-10-CM | POA: Diagnosis not present

## 2019-11-14 NOTE — Progress Notes (Signed)
Vial exp 11-13-20

## 2019-11-15 ENCOUNTER — Ambulatory Visit (INDEPENDENT_AMBULATORY_CARE_PROVIDER_SITE_OTHER): Payer: Medicare Other

## 2019-11-15 DIAGNOSIS — J309 Allergic rhinitis, unspecified: Secondary | ICD-10-CM | POA: Diagnosis not present

## 2019-12-09 ENCOUNTER — Ambulatory Visit (INDEPENDENT_AMBULATORY_CARE_PROVIDER_SITE_OTHER): Payer: Medicare Other

## 2019-12-09 DIAGNOSIS — J309 Allergic rhinitis, unspecified: Secondary | ICD-10-CM

## 2020-01-06 ENCOUNTER — Ambulatory Visit (INDEPENDENT_AMBULATORY_CARE_PROVIDER_SITE_OTHER): Payer: Medicare Other

## 2020-01-06 DIAGNOSIS — J309 Allergic rhinitis, unspecified: Secondary | ICD-10-CM | POA: Diagnosis not present

## 2020-01-14 ENCOUNTER — Ambulatory Visit (INDEPENDENT_AMBULATORY_CARE_PROVIDER_SITE_OTHER): Payer: Medicare Other

## 2020-01-14 DIAGNOSIS — J309 Allergic rhinitis, unspecified: Secondary | ICD-10-CM | POA: Diagnosis not present

## 2020-01-20 ENCOUNTER — Ambulatory Visit: Payer: Medicare Other | Admitting: Pediatrics

## 2020-01-21 ENCOUNTER — Ambulatory Visit: Payer: Medicare Other | Admitting: Pediatrics

## 2020-01-22 ENCOUNTER — Ambulatory Visit (INDEPENDENT_AMBULATORY_CARE_PROVIDER_SITE_OTHER): Payer: Medicare Other

## 2020-01-22 DIAGNOSIS — J309 Allergic rhinitis, unspecified: Secondary | ICD-10-CM

## 2020-01-27 ENCOUNTER — Ambulatory Visit: Payer: Medicare Other | Admitting: Pediatrics

## 2020-02-03 ENCOUNTER — Ambulatory Visit (INDEPENDENT_AMBULATORY_CARE_PROVIDER_SITE_OTHER): Payer: Medicare Other

## 2020-02-03 DIAGNOSIS — J309 Allergic rhinitis, unspecified: Secondary | ICD-10-CM | POA: Diagnosis not present

## 2020-02-10 ENCOUNTER — Ambulatory Visit (INDEPENDENT_AMBULATORY_CARE_PROVIDER_SITE_OTHER): Payer: Medicare Other

## 2020-02-10 DIAGNOSIS — J309 Allergic rhinitis, unspecified: Secondary | ICD-10-CM

## 2020-02-11 ENCOUNTER — Ambulatory Visit: Payer: Medicare Other | Admitting: Pediatrics

## 2020-03-03 ENCOUNTER — Ambulatory Visit (INDEPENDENT_AMBULATORY_CARE_PROVIDER_SITE_OTHER): Payer: Medicare Other | Admitting: Pediatrics

## 2020-03-03 ENCOUNTER — Other Ambulatory Visit: Payer: Self-pay

## 2020-03-03 ENCOUNTER — Encounter: Payer: Self-pay | Admitting: Pediatrics

## 2020-03-03 VITALS — BP 116/72 | HR 58 | Temp 97.7°F | Resp 16 | Ht 68.0 in | Wt 151.6 lb

## 2020-03-03 DIAGNOSIS — J309 Allergic rhinitis, unspecified: Secondary | ICD-10-CM | POA: Diagnosis not present

## 2020-03-03 DIAGNOSIS — I10 Essential (primary) hypertension: Secondary | ICD-10-CM | POA: Diagnosis not present

## 2020-03-03 NOTE — Progress Notes (Signed)
[place] [address] Dept: [phone]  FOLLOW UP NOTE  Patient ID: Brian Curtis, male    DOB: October 15, 1946  Age: 74 y.o. MRN: [id number] Date of Office Visit: 03/03/2020  Assessment  Chief Complaint: Allergies  HPI RAKIEM FACTOR presents for follow-up of allergic rhinitis.  He was last seen January 15, 2019 by Dr. Shaune Leeks.  Allergic rhinitis is reported as well controlled with the use of saline nasal irrigations at night and allergy injections every 3 to 4 weeks.  He reports that he is doing well with his allergy injections and would like to continue.  He denies any large local reactions.  Currently he is not needing to take an antihistamine or fluticasone nasal spray.   Drug Allergies:  No Known Allergies  Physical Exam: BP 116/72   Pulse (!) 58   Temp 97.7 F (36.5 C) (Oral)   Resp 16   Ht 5\' 8"  (1.727 m)   Wt 151 lb 9.6 oz (68.8 kg)   SpO2 99%   BMI 23.05 kg/m    Physical Exam Constitutional:      Appearance: Normal appearance.  HENT:     Head: Normocephalic and atraumatic.     Comments: Pharynx normal. Eyes normal. Ears normal. Nose normal    Right Ear: Tympanic membrane, ear canal and external ear normal.     Left Ear: Tympanic membrane, ear canal and external ear normal.     Nose: Nose normal.     Mouth/Throat:     Mouth: Mucous membranes are moist.     Pharynx: Oropharynx is clear.  Eyes:     Conjunctiva/sclera: Conjunctivae normal.  Cardiovascular:     Rate and Rhythm: Normal rate and regular rhythm.     Heart sounds: Normal heart sounds.     Comments: S1 and S2 present Pulmonary:     Effort: Pulmonary effort is normal.     Breath sounds: Normal breath sounds.     Comments: Lungs clear to auscultation Skin:    General: Skin is warm.  Neurological:     General: No focal deficit present.     Mental Status: He is alert and oriented to person, place, and time.  Psychiatric:        Mood and Affect: Mood normal.        Behavior:  Behavior normal.        Thought Content: Thought content normal.        Judgment: Judgment normal.     Diagnostics: None    Assessment and Plan: 1. Allergic rhinitis, unspecified seasonality, unspecified trigger   2. Essential hypertension     No orders of the defined types were placed in this encounter.   Patient Instructions  Allergic Rhinitis  Continue on allergy injections every 4 weeks Continue nasal saline irrigations at night May use loratadine 10 mg once a day as needed for runny nose May use fluticasone nose spray 2 sprays each nostril once a day as needed for stuffy nose Continue on your other medications  If he gets a sinus infection when he travels, he may use amoxicillin 500 mg 1 capsule 3 times a day for 10 days. Please let us know if this treatment plan is not working for you.  Return in about 1 year   Return in about 1 year (around 03/03/2021), or if symptoms worsen or fail to improve.   Thank you for the opportunity to care for this patient. Please do not hesitate to contact  me with questions.  Althea Charon, FNP [facility]   I have provided oversight concerning Enis Gash' evaluation and treatment of this patient's health issues addressed during today's encounter. I agree with the assessment and therapeutic plan as outlined in the note.   Thank you for the opportunity to care for this patient.  Please do not hesitate to contact me with questions.  Penne Lash, M.D.  [facility] [address] [place], [place] [address] [phone]

## 2020-03-03 NOTE — Patient Instructions (Addendum)
Allergic Rhinitis  Continue on allergy injections every 4 weeks Continue nasal saline irrigations at night May use loratadine 10 mg once a day as needed for runny nose May use fluticasone nose spray 2 sprays each nostril once a day as needed for stuffy nose Continue on your other medications  If he gets a sinus infection when he travels, he may use amoxicillin 500 mg 1 capsule 3 times a day for 10 days. Please let us know if this treatment plan is not working for you.  Return in about 1 year

## 2020-03-24 ENCOUNTER — Ambulatory Visit (INDEPENDENT_AMBULATORY_CARE_PROVIDER_SITE_OTHER): Payer: Medicare Other

## 2020-03-24 DIAGNOSIS — J309 Allergic rhinitis, unspecified: Secondary | ICD-10-CM

## 2020-03-24 NOTE — Progress Notes (Signed)
VIAL EXP 03-24-21

## 2020-03-25 DIAGNOSIS — J301 Allergic rhinitis due to pollen: Secondary | ICD-10-CM | POA: Diagnosis not present

## 2020-04-17 ENCOUNTER — Ambulatory Visit (INDEPENDENT_AMBULATORY_CARE_PROVIDER_SITE_OTHER): Payer: Medicare Other

## 2020-04-17 DIAGNOSIS — J309 Allergic rhinitis, unspecified: Secondary | ICD-10-CM

## 2020-05-01 ENCOUNTER — Ambulatory Visit (INDEPENDENT_AMBULATORY_CARE_PROVIDER_SITE_OTHER): Payer: Medicare Other

## 2020-05-01 DIAGNOSIS — J309 Allergic rhinitis, unspecified: Secondary | ICD-10-CM

## 2020-05-22 ENCOUNTER — Ambulatory Visit (INDEPENDENT_AMBULATORY_CARE_PROVIDER_SITE_OTHER): Payer: Medicare Other

## 2020-05-22 DIAGNOSIS — J309 Allergic rhinitis, unspecified: Secondary | ICD-10-CM | POA: Diagnosis not present

## 2020-06-08 NOTE — Progress Notes (Signed)
VIAL EXP 06-08-21

## 2020-06-16 ENCOUNTER — Ambulatory Visit (INDEPENDENT_AMBULATORY_CARE_PROVIDER_SITE_OTHER): Payer: Medicare Other | Admitting: *Deleted

## 2020-06-16 DIAGNOSIS — J309 Allergic rhinitis, unspecified: Secondary | ICD-10-CM | POA: Diagnosis not present

## 2020-06-24 ENCOUNTER — Ambulatory Visit (INDEPENDENT_AMBULATORY_CARE_PROVIDER_SITE_OTHER): Payer: Medicare Other

## 2020-06-24 DIAGNOSIS — J309 Allergic rhinitis, unspecified: Secondary | ICD-10-CM | POA: Diagnosis not present

## 2020-07-02 ENCOUNTER — Ambulatory Visit (INDEPENDENT_AMBULATORY_CARE_PROVIDER_SITE_OTHER): Payer: Medicare Other

## 2020-07-02 DIAGNOSIS — J309 Allergic rhinitis, unspecified: Secondary | ICD-10-CM | POA: Diagnosis not present

## 2020-07-09 ENCOUNTER — Ambulatory Visit (INDEPENDENT_AMBULATORY_CARE_PROVIDER_SITE_OTHER): Payer: Medicare Other

## 2020-07-09 DIAGNOSIS — J309 Allergic rhinitis, unspecified: Secondary | ICD-10-CM | POA: Diagnosis not present

## 2020-07-21 ENCOUNTER — Ambulatory Visit (INDEPENDENT_AMBULATORY_CARE_PROVIDER_SITE_OTHER): Payer: Medicare Other

## 2020-07-21 DIAGNOSIS — J309 Allergic rhinitis, unspecified: Secondary | ICD-10-CM | POA: Diagnosis not present

## 2020-08-11 ENCOUNTER — Ambulatory Visit (INDEPENDENT_AMBULATORY_CARE_PROVIDER_SITE_OTHER): Payer: Medicare Other

## 2020-08-11 DIAGNOSIS — J309 Allergic rhinitis, unspecified: Secondary | ICD-10-CM

## 2020-09-01 ENCOUNTER — Ambulatory Visit (INDEPENDENT_AMBULATORY_CARE_PROVIDER_SITE_OTHER): Payer: Medicare Other

## 2020-09-01 DIAGNOSIS — J309 Allergic rhinitis, unspecified: Secondary | ICD-10-CM

## 2020-09-18 ENCOUNTER — Encounter: Payer: Self-pay | Admitting: Gastroenterology

## 2020-09-21 ENCOUNTER — Ambulatory Visit (INDEPENDENT_AMBULATORY_CARE_PROVIDER_SITE_OTHER): Payer: Medicare Other | Admitting: *Deleted

## 2020-09-21 DIAGNOSIS — J309 Allergic rhinitis, unspecified: Secondary | ICD-10-CM

## 2020-10-05 DIAGNOSIS — J301 Allergic rhinitis due to pollen: Secondary | ICD-10-CM | POA: Diagnosis not present

## 2020-10-05 NOTE — Progress Notes (Signed)
VIAL EXP 10-05-21

## 2020-10-12 ENCOUNTER — Ambulatory Visit (INDEPENDENT_AMBULATORY_CARE_PROVIDER_SITE_OTHER): Payer: Medicare Other

## 2020-10-12 DIAGNOSIS — J309 Allergic rhinitis, unspecified: Secondary | ICD-10-CM

## 2020-11-02 ENCOUNTER — Ambulatory Visit (INDEPENDENT_AMBULATORY_CARE_PROVIDER_SITE_OTHER): Payer: Medicare Other

## 2020-11-02 DIAGNOSIS — J309 Allergic rhinitis, unspecified: Secondary | ICD-10-CM

## 2020-11-11 ENCOUNTER — Encounter: Payer: Medicare Other | Admitting: Gastroenterology

## 2020-11-25 ENCOUNTER — Ambulatory Visit (INDEPENDENT_AMBULATORY_CARE_PROVIDER_SITE_OTHER): Payer: Medicare Other

## 2020-11-25 DIAGNOSIS — J309 Allergic rhinitis, unspecified: Secondary | ICD-10-CM

## 2020-12-01 ENCOUNTER — Ambulatory Visit (INDEPENDENT_AMBULATORY_CARE_PROVIDER_SITE_OTHER): Payer: Medicare Other

## 2020-12-01 DIAGNOSIS — J309 Allergic rhinitis, unspecified: Secondary | ICD-10-CM | POA: Diagnosis not present

## 2020-12-07 ENCOUNTER — Ambulatory Visit (INDEPENDENT_AMBULATORY_CARE_PROVIDER_SITE_OTHER): Payer: Medicare Other

## 2020-12-07 DIAGNOSIS — J309 Allergic rhinitis, unspecified: Secondary | ICD-10-CM | POA: Diagnosis not present

## 2020-12-16 ENCOUNTER — Encounter: Payer: Medicare Other | Admitting: Gastroenterology

## 2020-12-17 ENCOUNTER — Ambulatory Visit (INDEPENDENT_AMBULATORY_CARE_PROVIDER_SITE_OTHER): Payer: Medicare Other

## 2020-12-17 DIAGNOSIS — J309 Allergic rhinitis, unspecified: Secondary | ICD-10-CM | POA: Diagnosis not present

## 2020-12-24 ENCOUNTER — Ambulatory Visit (INDEPENDENT_AMBULATORY_CARE_PROVIDER_SITE_OTHER): Payer: Medicare Other

## 2020-12-24 DIAGNOSIS — J309 Allergic rhinitis, unspecified: Secondary | ICD-10-CM | POA: Diagnosis not present

## 2021-01-12 ENCOUNTER — Ambulatory Visit (INDEPENDENT_AMBULATORY_CARE_PROVIDER_SITE_OTHER): Payer: Medicare Other

## 2021-01-12 DIAGNOSIS — J309 Allergic rhinitis, unspecified: Secondary | ICD-10-CM

## 2021-02-01 ENCOUNTER — Ambulatory Visit (INDEPENDENT_AMBULATORY_CARE_PROVIDER_SITE_OTHER): Payer: Medicare Other | Admitting: Allergy & Immunology

## 2021-02-01 ENCOUNTER — Encounter: Payer: Self-pay | Admitting: Allergy & Immunology

## 2021-02-01 ENCOUNTER — Other Ambulatory Visit: Payer: Self-pay

## 2021-02-01 VITALS — BP 134/70 | HR 66 | Temp 97.8°F | Resp 18 | Ht 68.5 in | Wt 155.6 lb

## 2021-02-01 DIAGNOSIS — J3089 Other allergic rhinitis: Secondary | ICD-10-CM | POA: Diagnosis not present

## 2021-02-01 DIAGNOSIS — J302 Other seasonal allergic rhinitis: Secondary | ICD-10-CM | POA: Diagnosis not present

## 2021-02-01 DIAGNOSIS — J0100 Acute maxillary sinusitis, unspecified: Secondary | ICD-10-CM

## 2021-02-01 MED ORDER — AMOXICILLIN-POT CLAVULANATE 875-125 MG PO TABS: 1.0000 | ORAL_TABLET | Freq: Two times a day (BID) | ORAL | 0 refills | Status: AC

## 2021-02-01 NOTE — Progress Notes (Signed)
FOLLOW UP  Date of Service/Encounter:  02/01/21   Assessment:   Perennial and seasonal allergic rhinitis - on allergen immunotherapy for 30+ years  Acute sinusitis - starting Augmentin  Plan/Recommendations:   1. Acute non-recurrent maxillary sinusitis - Start Augmentin twice daily for 10 days. - Continue with nasal saline rinses as you are doing. - Consider Mucinex to help with mucous clearance.  - Start the prednisone pack if you are not feeling better in a few days.   2. Seasonal and perennial allergic rhinitis - Continue with allergy shots as you are doing.  - Continue with the nasal saline rinses.   3. Return in about 1 year (around 02/01/2022).   Subjective:   DAVIN ARCHULETTA is a 75 y.o. male presenting today for follow up of No chief complaint on file.   Harlin Heys has a history of the following: Patient Active Problem List   Diagnosis Date Noted  . 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 Unilateral primary osteoarthritis, left hip 09/04/2017  . DD (diverticular disease) 12/17/2015  . Calculus of kidney 12/17/2015  . Seasonal allergic conjunctivitis 12/17/2015  . Viral upper respiratory tract infection 11/10/2015  . Other allergic rhinitis 11/10/2015  . Essential hypertension 11/10/2015  . Allergic rhinitis 07/31/2015  . BP (high blood pressure) 10/04/2011    History obtained from: chart review and patient.  Emile is a 75 y.o. male presenting for a sick visit. He was last seen in April 2021 by Althea Charon, one of our nurse practitioners. At that time, he was continued on nasal saline rinses as well as Claritin and Flonase as needed. He was given a prescription for amoxicillin to use if needed if he got sick while he was traveling, but he never needed it. He was continued on his allergy shots every 4 weeks.  Since last visit, he has done very well. However, he presents today for sick visit. He feels that he has a sinus infection. He  reports that his wife was sick last Thursday and was put on Augmentin. He is fully boosted and wears a mask everywhere. He denies sick contacts aside from his wife who is now on the mend with her antibiotics.   He is on allergy immunotherapy monthly. He has been on this for 30+ years. Symptoms are controlled with allergy shots as well as nasal saline rinses. He and his wife have worked for the [employer]. They both were able to retire at age 39 and has had a nice time spending more time together.   Otherwise, there have been no changes to his past medical history, surgical history, family history, or social history.    Review of Systems  Constitutional: Negative.  Negative for fever, malaise/fatigue and weight loss.  HENT: Positive for congestion and sinus pain. Negative for ear discharge and ear pain.   Eyes: Negative for pain, discharge and redness.  Respiratory: Negative for cough, sputum production, shortness of breath, wheezing and stridor.   Cardiovascular: Negative.  Negative for chest pain and palpitations.  Gastrointestinal: Negative for abdominal pain and heartburn.  Skin: Negative.  Negative for itching and rash.  Neurological: Negative for dizziness and headaches.  Endo/Heme/Allergies: Negative for environmental allergies. Does not bruise/bleed easily.       Objective:   Blood pressure 134/70, pulse 66, temperature 97.8 F (36.6 C), temperature source Temporal, resp. rate 18, height 5' 8.5" (1.74 m), weight 155 lb 9.6 oz (70.6 kg), SpO2 98 %. Body mass  index is 23.31 kg/m.   Physical Exam:  Physical Exam Constitutional:      Appearance: He is well-developed.     Comments: Very pleasant and talkative.   HENT:     Head: Normocephalic and atraumatic.     Right Ear: Tympanic membrane, ear canal and external ear normal.     Left Ear: Tympanic membrane, ear canal and external ear normal.     Nose: No nasal deformity, septal deviation, mucosal edema, rhinorrhea or epistaxis.      Right Turbinates: Enlarged and swollen.     Left Turbinates: Enlarged and swollen.     Right Sinus: Maxillary sinus tenderness present. No frontal sinus tenderness.     Left Sinus: Maxillary sinus tenderness present. No frontal sinus tenderness.     Mouth/Throat:     Mouth: Oropharynx is clear and moist. Mucous membranes are not pale and not dry.     Pharynx: Uvula midline.  Eyes:     General:        Right eye: No discharge.        Left eye: No discharge.     Extraocular Movements: EOM normal.     Conjunctiva/sclera: Conjunctivae normal.     Right eye: Right conjunctiva is not injected. No chemosis.    Left eye: Left conjunctiva is not injected. No chemosis.    Pupils: Pupils are equal, round, and reactive to light.  Cardiovascular:     Rate and Rhythm: Normal rate and regular rhythm.     Heart sounds: Normal heart sounds.  Pulmonary:     Effort: Pulmonary effort is normal. No tachypnea, accessory muscle usage or respiratory distress.     Breath sounds: Normal breath sounds. No wheezing, rhonchi or rales.  Chest:     Chest wall: No tenderness.  Lymphadenopathy:     Cervical: No cervical adenopathy.  Skin:    Coloration: Skin is not pale.     Findings: No abrasion, erythema, petechiae or rash. Rash is not papular, urticarial or vesicular.  Neurological:     Mental Status: He is alert.  Psychiatric:        Mood and Affect: Mood and affect normal.      Diagnostic studies: none       Salvatore Marvel, MD  [facility]

## 2021-02-01 NOTE — Patient Instructions (Addendum)
1. Acute non-recurrent maxillary sinusitis - Start Augmentin twice daily for 10 days. - Continue with nasal saline rinses as you are doing. - Consider Mucinex to help with mucous clearance.  - Start the prednisone pack if you are not feeling better in a few days.   2. Seasonal and perennial allergic rhinitis - Continue with allergy shots as you are doing.  - Continue with the nasal saline rinses.   3. Return in about 1 year (around 02/01/2022).    Please inform us of any Emergency Department visits, hospitalizations, or changes in symptoms. Call us before going to the ED for breathing or allergy symptoms since we might be able to fit you in for a sick visit. Feel free to contact us anytime with any questions, problems, or concerns.  It was a pleasure to meet you today!  Websites that have reliable patient information: 1. American Academy of Asthma, Allergy, and Immunology: www.aaaai.org 2. Food Allergy Research and Education (FARE): foodallergy.org 3. Mothers of Asthmatics: http://www.asthmacommunitynetwork.org 4. American College of Allergy, Asthma, and Immunology: www.acaai.org   COVID-19 Vaccine Information can be found at: ShippingScam.co.uk For questions related to vaccine distribution or appointments, please email [email or web address] or call [phone].   We realize that you might be concerned about having an allergic reaction to the COVID19 vaccines. To help with that concern, WE ARE OFFERING THE COVID19 VACCINES IN OUR OFFICE! Ask the front desk for dates!     "Like" [place] on Facebook and Instagram for our latest updates!      A healthy democracy works best when [employer] participate! Make sure you are registered to vote! If you have moved or changed any of your contact information, you will need to get this updated before voting!  In some cases, you MAY be able to register to vote online:  [email or web address]

## 2021-02-12 ENCOUNTER — Ambulatory Visit (INDEPENDENT_AMBULATORY_CARE_PROVIDER_SITE_OTHER): Payer: Medicare Other

## 2021-02-12 DIAGNOSIS — J309 Allergic rhinitis, unspecified: Secondary | ICD-10-CM

## 2021-03-10 ENCOUNTER — Ambulatory Visit (INDEPENDENT_AMBULATORY_CARE_PROVIDER_SITE_OTHER): Payer: Medicare Other | Admitting: *Deleted

## 2021-03-10 DIAGNOSIS — J309 Allergic rhinitis, unspecified: Secondary | ICD-10-CM | POA: Diagnosis not present

## 2021-03-11 DIAGNOSIS — J301 Allergic rhinitis due to pollen: Secondary | ICD-10-CM | POA: Diagnosis not present

## 2021-03-11 NOTE — Progress Notes (Signed)
VIAL EXP 03-11-22

## 2021-03-18 ENCOUNTER — Ambulatory Visit (AMBULATORY_SURGERY_CENTER): Payer: Self-pay | Admitting: *Deleted

## 2021-03-18 ENCOUNTER — Other Ambulatory Visit: Payer: Self-pay

## 2021-03-18 VITALS — Ht 68.5 in | Wt 152.0 lb

## 2021-03-18 DIAGNOSIS — Z86010 Personal history of colonic polyps: Secondary | ICD-10-CM

## 2021-03-18 MED ORDER — NA SULFATE-K SULFATE-MG SULF 17.5-3.13-1.6 GM/177ML PO SOLN: ORAL | 0 refills | Status: DC

## 2021-03-18 NOTE — Progress Notes (Signed)
Patient is here in-person for PV. Patient denies any allergies to eggs or soy. Patient denies any problems with anesthesia/sedation. Patient denies any oxygen use at home. Patient denies taking any diet/weight loss medications or blood thinners. Patient is not being treated for MRSA or C-diff. Patient is aware of our care-partner policy and [id number]-[id number] safety protocol.  Patient is fully COVID-19 vaccinated, per patient.  Pt aware of the cost of suprep. 2day prep per recall sheet.

## 2021-03-29 ENCOUNTER — Ambulatory Visit (INDEPENDENT_AMBULATORY_CARE_PROVIDER_SITE_OTHER): Payer: Medicare Other

## 2021-03-29 DIAGNOSIS — J309 Allergic rhinitis, unspecified: Secondary | ICD-10-CM | POA: Diagnosis not present

## 2021-04-01 ENCOUNTER — Encounter: Payer: Medicare Other | Admitting: Gastroenterology

## 2021-04-27 ENCOUNTER — Ambulatory Visit (INDEPENDENT_AMBULATORY_CARE_PROVIDER_SITE_OTHER): Payer: Medicare Other

## 2021-04-27 DIAGNOSIS — J309 Allergic rhinitis, unspecified: Secondary | ICD-10-CM

## 2021-05-25 ENCOUNTER — Ambulatory Visit (INDEPENDENT_AMBULATORY_CARE_PROVIDER_SITE_OTHER): Payer: Medicare Other

## 2021-05-25 DIAGNOSIS — J309 Allergic rhinitis, unspecified: Secondary | ICD-10-CM

## 2021-06-02 ENCOUNTER — Telehealth: Payer: Self-pay | Admitting: Gastroenterology

## 2021-06-02 NOTE — Telephone Encounter (Signed)
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 He forgot to mention that during his pv with nurse. Because of that, he usually has to take 4 amoxicilins to prevent any kind of infection when he has dental work. Pt wants to know if he needs to take antibiotics before his procedure. Pls call him.

## 2021-06-02 NOTE — Telephone Encounter (Signed)
Called patient, no answer. Left message that he does not need to take antibiotics before a colonoscopy per our protocol.

## 2021-06-10 ENCOUNTER — Ambulatory Visit (AMBULATORY_SURGERY_CENTER): Payer: Medicare Other | Admitting: Gastroenterology

## 2021-06-10 ENCOUNTER — Encounter: Payer: Self-pay | Admitting: Gastroenterology

## 2021-06-10 ENCOUNTER — Other Ambulatory Visit: Payer: Self-pay

## 2021-06-10 VITALS — BP 88/50 | HR 58 | Temp 97.5°F | Resp 11 | Ht 68.5 in | Wt 152.0 lb

## 2021-06-10 DIAGNOSIS — Z86010 Personal history of colonic polyps: Secondary | ICD-10-CM

## 2021-06-10 MED ORDER — SODIUM CHLORIDE 0.9 % IV SOLN: 500.0000 mL | Freq: Once | INTRAVENOUS | Status: DC

## 2021-06-10 NOTE — Progress Notes (Signed)
Medical history reviewed with no changes noted. VS assessed by C.W 

## 2021-06-10 NOTE — Patient Instructions (Signed)
YOU HAD AN ENDOSCOPIC PROCEDURE TODAY AT THE [facility]:   Refer to the procedure report that was given to you for any specific questions about what was found during the examination.  If the procedure report does not answer your questions, please call your gastroenterologist to clarify.  If you requested that your care partner not be given the details of your procedure findings, then the procedure report has been included in a sealed envelope for you to review at your convenience later.  YOU SHOULD EXPECT: Some feelings of bloating in the abdomen. Passage of more gas than usual.  Walking can help get rid of the air that was put into your GI tract during the procedure and reduce the bloating. If you had a lower endoscopy (such as a colonoscopy or flexible sigmoidoscopy) you may notice spotting of blood in your stool or on the toilet paper. If you underwent a bowel prep for your procedure, you may not have a normal bowel movement for a few days.  Please Note:  You might notice some irritation and congestion in your nose or some drainage.  This is from the oxygen used during your procedure.  There is no need for concern and it should clear up in a day or so.  SYMPTOMS TO REPORT IMMEDIATELY:   Following lower endoscopy (colonoscopy or flexible sigmoidoscopy):  Excessive amounts of blood in the stool  Significant tenderness or worsening of abdominal pains  Swelling of the abdomen that is new, acute  Fever of 100F or higher   Following upper endoscopy (EGD)  Vomiting of blood or coffee ground material  New chest pain or pain under the shoulder blades  Painful or persistently difficult swallowing  New shortness of breath  Fever of 100F or higher  Black, tarry-looking stools  For urgent or emergent issues, a gastroenterologist can be reached at any hour by calling [phone]. Do not use MyChart messaging for urgent concerns.    DIET:  We do recommend a small meal at first, but  then you may proceed to your regular diet.  Drink plenty of fluids but you should avoid alcoholic beverages for 24 hours.  ACTIVITY:  You should plan to take it easy for the rest of today and you should NOT DRIVE or use heavy machinery until tomorrow (because of the sedation medicines used during the test).    FOLLOW UP: Our staff will call the number listed on your records 48-72 hours following your procedure to check on you and address any questions or concerns that you may have regarding the information given to you following your procedure. If we do not reach you, we will leave a message.  We will attempt to reach you two times.  During this call, we will ask if you have developed any symptoms of COVID 19. If you develop any symptoms (ie: fever, flu-like symptoms, shortness of breath, cough etc.) before then, please call [phone].  If you test positive for Covid 19 in the 2 weeks post procedure, please call and report this information to us.    If any biopsies were taken you will be contacted by phone or by letter within the next 1-3 weeks.  Please call us at [phone] if you have not heard about the biopsies in 3 weeks.    SIGNATURES/CONFIDENTIALITY: You and/or your care partner have signed paperwork which will be entered into your electronic medical record.  These signatures attest to the fact that that the information above on   your After Visit Summary has been reviewed and is understood.  Full responsibility of the confidentiality of this discharge information lies with you and/or your care-partner. 

## 2021-06-10 NOTE — Progress Notes (Signed)
pt tolerated well. VSS. awake and to recovery. Report given to RN.  

## 2021-06-10 NOTE — Op Note (Signed)
[facility] Patient Name: Brian Curtis Procedure Date: 06/10/2021 10:31 AM MRN: [id number] Endoscopist: Ladene Artist , MD Age: 75 Referring MD:  Date of Birth: [birth year] Gender: Male Account #: [id number] Procedure:                Colonoscopy Indications:              Surveillance: Personal history of adenomatous                            polyps on last colonoscopy > 5 years ago Medicines:                Monitored Anesthesia Care Procedure:                Pre-Anesthesia Assessment:                           - Prior to the procedure, a History and Physical                            was performed, and patient medications and                            allergies were reviewed. The patient's tolerance of                            previous anesthesia was also reviewed. The risks                            and benefits of the procedure and the sedation                            options and risks were discussed with the patient.                            All questions were answered, and informed consent                            was obtained. Prior Anticoagulants: The patient has                            taken no previous anticoagulant or antiplatelet                            agents. ASA Grade Assessment: II - A patient with                            mild systemic disease. After reviewing the risks                            and benefits, the patient was deemed in                            satisfactory condition to undergo the procedure.  After obtaining informed consent, the colonoscope                            was passed under direct vision. Throughout the                            procedure, the patient's blood pressure, pulse, and                            oxygen saturations were monitored continuously. The                            Olympus CF-HQ190L [id number]) Colonoscope was                            introduced through the anus  and advanced to the the                            cecum, identified by appendiceal orifice and                            ileocecal valve. The ileocecal valve, appendiceal                            orifice, and rectum were photographed. The quality                            of the bowel preparation was adequate. The                            colonoscopy was performed without difficulty. The                            patient tolerated the procedure well. Scope In: 10:45:49 AM Scope Out: 11:00:48 AM Scope Withdrawal Time: 0 hours 12 minutes 43 seconds  Total Procedure Duration: 0 hours 14 minutes 59 seconds  Findings:                 The perianal and digital rectal examinations were                            normal.                           Multiple medium-mouthed diverticula were found in                            the left colon. There was narrowing of the colon in                            association with the diverticular opening. There                            was evidence of diverticular spasm. There was  evidence of an impacted diverticulum. There was no                            evidence of diverticular bleeding.                           The exam was otherwise without abnormality on                            direct and retroflexion views. Complications:            No immediate complications. Estimated blood loss:                            None. Estimated Blood Loss:     Estimated blood loss: none. Impression:               - Moderate diverticulosis in the left colon.                           - The examination was otherwise normal on direct                            and retroflexion views.                           - No specimens collected. Recommendation:           - Patient has a contact number available for                            emergencies. The signs and symptoms of potential                            delayed complications were  discussed with the                            patient. Return to normal activities tomorrow.                            Written discharge instructions were provided to the                            patient.                           - High fiber diet.                           - Continue present medications.                           - No repeat colonoscopy due to the absence of                            colonic polyps. Ladene Artist, MD 06/10/2021 11:03:50 AM This report has been signed electronically.

## 2021-06-14 ENCOUNTER — Telehealth: Payer: Self-pay

## 2021-06-14 NOTE — Telephone Encounter (Signed)
  Follow up Call-  Call back number 06/10/2021  Post procedure Call Back phone  # [phone]  Permission to leave phone message Yes  Some recent data might be hidden     Patient questions:  Do you have a fever, pain , or abdominal swelling? No. Pain Score  0 *  Have you tolerated food without any problems? Yes.    Have you been able to return to your normal activities? Yes.    Do you have any questions about your discharge instructions: Diet   No. Medications  No. Follow up visit  No.  Do you have questions or concerns about your Care? No.  Actions: * If pain score is 4 or above: No action needed, pain <4.  Have you developed a fever since your procedure? no  2.   Have you had an respiratory symptoms (SOB or cough) since your procedure? no  3.   Have you tested positive for COVID 19 since your procedure no  4.   Have you had any family members/close contacts diagnosed with the COVID 19 since your procedure?  no   If yes to any of these questions please route to Joylene John, RN and Joella Prince, RN

## 2021-06-16 ENCOUNTER — Ambulatory Visit (INDEPENDENT_AMBULATORY_CARE_PROVIDER_SITE_OTHER): Payer: Medicare Other

## 2021-06-16 DIAGNOSIS — J309 Allergic rhinitis, unspecified: Secondary | ICD-10-CM | POA: Diagnosis not present

## 2021-07-07 ENCOUNTER — Ambulatory Visit (INDEPENDENT_AMBULATORY_CARE_PROVIDER_SITE_OTHER): Payer: Medicare Other

## 2021-07-07 DIAGNOSIS — J309 Allergic rhinitis, unspecified: Secondary | ICD-10-CM | POA: Diagnosis not present

## 2021-08-09 ENCOUNTER — Ambulatory Visit (INDEPENDENT_AMBULATORY_CARE_PROVIDER_SITE_OTHER): Payer: Medicare Other

## 2021-08-09 DIAGNOSIS — J309 Allergic rhinitis, unspecified: Secondary | ICD-10-CM | POA: Diagnosis not present

## 2021-08-23 ENCOUNTER — Ambulatory Visit (INDEPENDENT_AMBULATORY_CARE_PROVIDER_SITE_OTHER): Payer: Medicare Other

## 2021-08-23 DIAGNOSIS — J309 Allergic rhinitis, unspecified: Secondary | ICD-10-CM

## 2021-09-10 ENCOUNTER — Ambulatory Visit (INDEPENDENT_AMBULATORY_CARE_PROVIDER_SITE_OTHER): Payer: Medicare Other

## 2021-09-10 DIAGNOSIS — J309 Allergic rhinitis, unspecified: Secondary | ICD-10-CM

## 2021-09-23 ENCOUNTER — Ambulatory Visit (INDEPENDENT_AMBULATORY_CARE_PROVIDER_SITE_OTHER): Payer: Medicare Other

## 2021-09-23 DIAGNOSIS — J309 Allergic rhinitis, unspecified: Secondary | ICD-10-CM | POA: Diagnosis not present

## 2021-10-05 ENCOUNTER — Ambulatory Visit (INDEPENDENT_AMBULATORY_CARE_PROVIDER_SITE_OTHER): Payer: Medicare Other

## 2021-10-05 DIAGNOSIS — J309 Allergic rhinitis, unspecified: Secondary | ICD-10-CM

## 2021-10-26 ENCOUNTER — Ambulatory Visit (INDEPENDENT_AMBULATORY_CARE_PROVIDER_SITE_OTHER): Payer: Medicare Other

## 2021-10-26 DIAGNOSIS — J309 Allergic rhinitis, unspecified: Secondary | ICD-10-CM | POA: Diagnosis not present

## 2021-11-01 DIAGNOSIS — J3089 Other allergic rhinitis: Secondary | ICD-10-CM | POA: Diagnosis not present

## 2021-11-01 NOTE — Progress Notes (Signed)
VIAL MADE. EXP 11-01-22

## 2021-11-10 ENCOUNTER — Ambulatory Visit: Payer: Medicare Other | Admitting: Orthopaedic Surgery

## 2021-11-24 ENCOUNTER — Ambulatory Visit (INDEPENDENT_AMBULATORY_CARE_PROVIDER_SITE_OTHER): Payer: Medicare Other

## 2021-11-24 DIAGNOSIS — J309 Allergic rhinitis, unspecified: Secondary | ICD-10-CM | POA: Diagnosis not present

## 2021-12-21 ENCOUNTER — Ambulatory Visit (INDEPENDENT_AMBULATORY_CARE_PROVIDER_SITE_OTHER): Payer: Medicare Other

## 2021-12-21 DIAGNOSIS — J309 Allergic rhinitis, unspecified: Secondary | ICD-10-CM | POA: Diagnosis not present

## 2022-01-24 ENCOUNTER — Ambulatory Visit (INDEPENDENT_AMBULATORY_CARE_PROVIDER_SITE_OTHER): Payer: Medicare Other | Admitting: Internal Medicine

## 2022-01-24 ENCOUNTER — Other Ambulatory Visit: Payer: Self-pay

## 2022-01-24 ENCOUNTER — Encounter: Payer: Self-pay | Admitting: Internal Medicine

## 2022-01-24 VITALS — BP 124/62 | HR 98 | Temp 98.0°F | Resp 20

## 2022-01-24 DIAGNOSIS — J0100 Acute maxillary sinusitis, unspecified: Secondary | ICD-10-CM

## 2022-01-24 DIAGNOSIS — J3089 Other allergic rhinitis: Secondary | ICD-10-CM | POA: Diagnosis not present

## 2022-01-24 DIAGNOSIS — J302 Other seasonal allergic rhinitis: Secondary | ICD-10-CM

## 2022-01-24 DIAGNOSIS — J309 Allergic rhinitis, unspecified: Secondary | ICD-10-CM

## 2022-01-24 MED ORDER — AMOXICILLIN-POT CLAVULANATE 875-125 MG PO TABS: 1.0000 | ORAL_TABLET | Freq: Two times a day (BID) | ORAL | 0 refills | Status: AC

## 2022-01-24 NOTE — Progress Notes (Signed)
FOLLOW UP Date of Service/Encounter:  01/24/22   Subjective:  Brian Curtis (DOB: July 01, 1946) is a 76 y.o. male who returns to the [facility] on 01/24/2022 in re-evaluation of the following: Acute nasal congestion and sinus pressure History obtained from: chart review and patient and wife with similar symptoms .  For Review, LV was on 02/01/2021 with Dr. Ernst Bowler seen for acute sinusitis.   At that visit he was treated with Augmentin for 10 days.  He has also been on allergy shots for many years and was continued on those.  Today he presents for an acute visit.   He reports over 5 days of little bit of cough with a rattle, congestion in his throat, sinuses are closed up.  He feels some facial pressure and pain.  He also has significant drainage but less than his wife.  His symptoms started about a week later than hers.  They have not been previously tested for flu or COVID.   Prior to this current illness, he has been okay for the past year.  Prior to last year sinus infection, had been 7 years since his previous sign infection. He denies any fevers or chills.  His symptoms have worsened over the past few days.  Allergies as of 01/24/2022   No Known Allergies      Medication List        Accurate as of January 24, 2022  3:23 PM. If you have any questions, ask your nurse or doctor.          STOP taking these medications    tretinoin 0.025 % cream Commonly known as: RETIN-A Stopped by: Sigurd Sos, MD       TAKE these medications    alendronate 35 MG tablet Commonly known as: FOSAMAX SMARTSIG:1 By Mouth Once a Week   CALCIUM-MAGNESIUM-ZINC-D3 PO Take 6 tablets by mouth 2 (two) times daily.   clobetasol 0.05 % external solution Commonly known as: TEMOVATE Apply 1 application topically daily as needed (for red blotches). Liquid and cream used   Cod Liver Oil 1250-130 units Caps Take 3 capsules by mouth daily.   lisinopril 10 MG tablet Commonly  known as: ZESTRIL Take 10 mg by mouth every evening.   lisinopril 10 MG tablet Commonly known as: ZESTRIL Take 1 tablet by mouth daily.   MULTIVITAMIN ADULTS 50+ PO Take 1 tablet by mouth daily.   NONFORMULARY OR COMPOUNDED ITEM Inject 1 each as directed every 21 ( twenty-one) days. ALLERGEN IMMUNOTHERAPY   PROBIOTIC DAILY PO Take 1 capsule by mouth daily.       Past Medical History:  Diagnosis Date   Allergy    allergy shots every 3 weeks   Hypertension    Lupus ([place])    MANAGED BY DR Jenny Reichmann HALL; PER PATIENT "HE SAY I AM IN REMISSION AND IT ONLY REALLY AFFECTED THE SKIN . I SEE HIM ONCE EVERY YEAR"    Osteoporosis    debateable per pt.    Past Surgical History:  Procedure Laterality Date   APPENDECTOMY  07/1983   COLONOSCOPY  11/2003   ESOPHAGOGASTRODUODENOSCOPY ENDOSCOPY  11/2003   HEMORROIDECTOMY  02/2006   with blood clot   HERNIA REPAIR  10/2010   "double" per pt.   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  ROS: All others negative except as noted per HPI.   Objective:  BP 124/62    Pulse 98    Temp 98 F (36.7 C) (Temporal)    Resp 20    SpO2 98%  There is no height or weight on file to calculate BMI. Physical Exam: General Appearance:  Alert, cooperative, no distress, appears stated age  Head:  Normocephalic, without obvious abnormality, atraumatic  Eyes:  Conjunctiva clear, EOM's intact  Nose: Nares normal, hypertrophic turbinates, normal mucosa, no visible anterior polyps, and septum midline  Throat: Lips, tongue normal; teeth and gums normal, normal posterior oropharynx and + cobblestoning   Neck: Supple, symmetrical  Lungs:   Respirations unlabored, no coughing  Heart:  regular rate and rhythm and no murmur, Appears well perfused  Extremities: No edema  Skin: Skin color, texture, turgor normal, no rashes or lesions on visualized portions of skin  Neurologic: No gross deficits   Assessment/Plan   1. Acute non-recurrent maxillary sinusitis - Start Augmentin twice daily for 10 days. - Continue with nasal saline rinses as you are doing. - Consider Mucinex to help with mucous clearance. But DRINK LOTS of WATER if you take this.  2. Seasonal and perennial allergic rhinitis - Continue with allergy shots as you are doing. Restart next week - Continue with the nasal saline rinses.   3. Return in about 1 year (around 01/24/2023).  Sooner if needed  Sigurd Sos, MD  [facility] and [facility]

## 2022-01-24 NOTE — Patient Instructions (Addendum)
1. Acute non-recurrent maxillary sinusitis - Start Augmentin twice daily for 10 days. - Continue with nasal saline rinses as you are doing. - Consider Mucinex to help with mucous clearance. But DRINK LOTS of WATER if you take this.  2. Seasonal and perennial allergic rhinitis - Continue with allergy shots as you are doing. Restart next week - Continue with the nasal saline rinses.   3. Return in about 1 year (around 01/24/2023).    Please inform us of any Emergency Department visits, hospitalizations, or changes in symptoms. Call us before going to the ED for breathing or allergy symptoms since we might be able to fit you in for a sick visit. Feel free to contact us anytime with any questions, problems, or concerns.  It was a pleasure to meet you today!

## 2022-02-07 ENCOUNTER — Ambulatory Visit (INDEPENDENT_AMBULATORY_CARE_PROVIDER_SITE_OTHER): Payer: Medicare Other | Admitting: *Deleted

## 2022-02-07 DIAGNOSIS — J309 Allergic rhinitis, unspecified: Secondary | ICD-10-CM

## 2022-02-17 ENCOUNTER — Ambulatory Visit (INDEPENDENT_AMBULATORY_CARE_PROVIDER_SITE_OTHER): Payer: Medicare Other

## 2022-02-17 DIAGNOSIS — J309 Allergic rhinitis, unspecified: Secondary | ICD-10-CM | POA: Diagnosis not present

## 2022-02-23 ENCOUNTER — Ambulatory Visit (INDEPENDENT_AMBULATORY_CARE_PROVIDER_SITE_OTHER): Payer: Medicare Other

## 2022-02-23 DIAGNOSIS — J309 Allergic rhinitis, unspecified: Secondary | ICD-10-CM | POA: Diagnosis not present

## 2022-02-28 ENCOUNTER — Ambulatory Visit (INDEPENDENT_AMBULATORY_CARE_PROVIDER_SITE_OTHER): Payer: Medicare Other

## 2022-02-28 DIAGNOSIS — J309 Allergic rhinitis, unspecified: Secondary | ICD-10-CM

## 2022-03-08 ENCOUNTER — Ambulatory Visit (INDEPENDENT_AMBULATORY_CARE_PROVIDER_SITE_OTHER): Payer: Medicare Other

## 2022-03-08 DIAGNOSIS — J309 Allergic rhinitis, unspecified: Secondary | ICD-10-CM

## 2022-03-22 ENCOUNTER — Ambulatory Visit (INDEPENDENT_AMBULATORY_CARE_PROVIDER_SITE_OTHER): Payer: Medicare Other

## 2022-03-22 DIAGNOSIS — J309 Allergic rhinitis, unspecified: Secondary | ICD-10-CM | POA: Diagnosis not present

## 2022-04-07 ENCOUNTER — Ambulatory Visit (INDEPENDENT_AMBULATORY_CARE_PROVIDER_SITE_OTHER): Payer: Medicare Other

## 2022-04-07 DIAGNOSIS — J309 Allergic rhinitis, unspecified: Secondary | ICD-10-CM | POA: Diagnosis not present

## 2022-04-27 ENCOUNTER — Ambulatory Visit (INDEPENDENT_AMBULATORY_CARE_PROVIDER_SITE_OTHER): Payer: Medicare Other

## 2022-04-27 DIAGNOSIS — J309 Allergic rhinitis, unspecified: Secondary | ICD-10-CM | POA: Diagnosis not present

## 2022-05-04 DIAGNOSIS — J3089 Other allergic rhinitis: Secondary | ICD-10-CM | POA: Diagnosis not present

## 2022-05-04 NOTE — Progress Notes (Signed)
EXP 05/05/23 

## 2022-05-12 ENCOUNTER — Ambulatory Visit (INDEPENDENT_AMBULATORY_CARE_PROVIDER_SITE_OTHER): Payer: Medicare Other

## 2022-05-12 DIAGNOSIS — J309 Allergic rhinitis, unspecified: Secondary | ICD-10-CM | POA: Diagnosis not present

## 2022-05-25 ENCOUNTER — Ambulatory Visit (INDEPENDENT_AMBULATORY_CARE_PROVIDER_SITE_OTHER): Payer: Medicare Other

## 2022-05-25 DIAGNOSIS — J309 Allergic rhinitis, unspecified: Secondary | ICD-10-CM | POA: Diagnosis not present

## 2022-06-09 ENCOUNTER — Ambulatory Visit (INDEPENDENT_AMBULATORY_CARE_PROVIDER_SITE_OTHER): Payer: Medicare Other

## 2022-06-09 DIAGNOSIS — J309 Allergic rhinitis, unspecified: Secondary | ICD-10-CM | POA: Diagnosis not present

## 2022-06-23 ENCOUNTER — Ambulatory Visit (INDEPENDENT_AMBULATORY_CARE_PROVIDER_SITE_OTHER): Payer: Medicare Other

## 2022-06-23 DIAGNOSIS — J309 Allergic rhinitis, unspecified: Secondary | ICD-10-CM | POA: Diagnosis not present

## 2022-07-01 ENCOUNTER — Ambulatory Visit (INDEPENDENT_AMBULATORY_CARE_PROVIDER_SITE_OTHER): Payer: Medicare Other | Admitting: *Deleted

## 2022-07-01 DIAGNOSIS — J309 Allergic rhinitis, unspecified: Secondary | ICD-10-CM

## 2022-07-07 ENCOUNTER — Ambulatory Visit (INDEPENDENT_AMBULATORY_CARE_PROVIDER_SITE_OTHER): Payer: Medicare Other

## 2022-07-07 DIAGNOSIS — J309 Allergic rhinitis, unspecified: Secondary | ICD-10-CM | POA: Diagnosis not present

## 2022-07-11 ENCOUNTER — Ambulatory Visit (INDEPENDENT_AMBULATORY_CARE_PROVIDER_SITE_OTHER): Payer: Medicare Other

## 2022-07-11 DIAGNOSIS — J309 Allergic rhinitis, unspecified: Secondary | ICD-10-CM | POA: Diagnosis not present

## 2022-07-21 ENCOUNTER — Ambulatory Visit (INDEPENDENT_AMBULATORY_CARE_PROVIDER_SITE_OTHER): Payer: Medicare Other

## 2022-07-21 DIAGNOSIS — J309 Allergic rhinitis, unspecified: Secondary | ICD-10-CM

## 2022-08-08 ENCOUNTER — Ambulatory Visit (INDEPENDENT_AMBULATORY_CARE_PROVIDER_SITE_OTHER): Payer: Medicare Other | Admitting: Internal Medicine

## 2022-08-08 ENCOUNTER — Encounter: Payer: Self-pay | Admitting: Internal Medicine

## 2022-08-08 VITALS — BP 134/70 | HR 57 | Temp 97.6°F | Resp 17 | Wt 147.0 lb

## 2022-08-08 DIAGNOSIS — J0100 Acute maxillary sinusitis, unspecified: Secondary | ICD-10-CM

## 2022-08-08 DIAGNOSIS — J3089 Other allergic rhinitis: Secondary | ICD-10-CM

## 2022-08-08 DIAGNOSIS — J302 Other seasonal allergic rhinitis: Secondary | ICD-10-CM

## 2022-08-08 MED ORDER — AMOXICILLIN-POT CLAVULANATE 875-125 MG PO TABS: 1.0000 | ORAL_TABLET | Freq: Two times a day (BID) | ORAL | 0 refills | Status: DC

## 2022-08-08 MED ORDER — AMOXICILLIN-POT CLAVULANATE 875-125 MG PO TABS: 1.0000 | ORAL_TABLET | Freq: Two times a day (BID) | ORAL | 0 refills | Status: AC

## 2022-08-08 NOTE — Patient Instructions (Addendum)
1. Acute non-recurrent maxillary sinusitis - Start Augmentin twice daily for 10 days. - Continue with nasal saline rinses as you are doing. - Consider Mucinex to help with mucous clearance. But DRINK LOTS of WATER if you take this. - I do not think you need prednisone at this time   2. Seasonal and perennial allergic rhinitis - Continue with allergy shots  - Continue with the nasal saline rinses.   Follow up: as needed   Thank you so much for letting me partake in your care today.  Don't hesitate to reach out if you have any additional concerns!  Roney Marion, MD  All[facility] and [facility] [place]

## 2022-08-08 NOTE — Progress Notes (Signed)
Follow Up Note  RE: Brian Curtis MRN: [id number] DOB: [birth year] Date of Office Visit: 08/08/2022  Referring provider: Marjory Sneddon, MD Primary care provider: Marjory Sneddon, MD  Chief Complaint: Sinusitis (Pt states he's been experiencing sinus pressure, runny nose with yellow, post nasal and left nostril is stuffed up. Onset of symptoms last Thursday.), Cough, and Nasal Congestion  History of Present Illness: I had the pleasure of seeing Brian Curtis for a follow up visit at the [facility] on 08/08/2022. He is a 76 y.o. male, who is being followed for allergic rhinitis and recurrent sinusitis . His previous allergy office visit was on 01/24/22 with Dr. Simona Huh. Today is a  acute visit for sinus infection  .  History obtained from patient, chart review. At last visit he was treated for a sinus infection with augmentin and mucinex.   Today he reports: his wife returned from a trip to [place] and developed a sinus infection.  He started symptoms Friday night: increased cough, nasal congestion, sinus pressures.  Similar symptoms to presentation in February.  This is his third sinus infection since March 2022.  Usually will respond well to Augmentin.  Denies any side effects from Augmentin.  Allergic Rhinitis - Immunotherapy: Vial 1 ([id number]); last injection 07/21/2022 of 0.5 mL of red vial - Large Local Reactions: denies  - Systemic Reactions: denies  - Beta Blockers: denies  -Uses sinus rinses daily   Assessment and Plan: Brian Curtis is a 76 y.o. male with: Acute non-recurrent maxillary sinusitis  Seasonal and perennial allergic rhinitis Plan: Patient Instructions  1. Acute non-recurrent maxillary sinusitis - Start Augmentin twice daily for 10 days. - Continue with nasal saline rinses as you are doing. - Consider Mucinex to help with mucous clearance. But DRINK LOTS of WATER if you take this. - I do not think you need prednisone at this time   2.  Seasonal and perennial allergic rhinitis - Continue with allergy shots  - Continue with the nasal saline rinses.   Follow up: as needed   Thank you so much for letting me partake in your care today.  Don't hesitate to reach out if you have any additional concerns!  Roney Marion, MD  [facility] [place] [place]      No follow-ups on file.  Meds ordered this encounter  Medications   DISCONTD: amoxicillin-clavulanate (AUGMENTIN) 875-125 MG tablet    Sig: Take 1 tablet by mouth 2 (two) times daily for 7 days.    Dispense:  14 tablet    Refill:  0   amoxicillin-clavulanate (AUGMENTIN) 875-125 MG tablet    Sig: Take 1 tablet by mouth 2 (two) times daily for 10 days.    Dispense:  20 tablet    Refill:  0    Lab Orders  No laboratory test(s) ordered today   Diagnostics: None done    Medication List:  Current Outpatient Medications  Medication Sig Dispense Refill   alendronate (FOSAMAX) 35 MG tablet SMARTSIG:1 By Mouth Once a Week     clobetasol (TEMOVATE) 0.05 % external solution Apply 1 application topically daily as needed (for red blotches). Liquid and cream used     Cod Liver Oil 1250-130 UNITS CAPS Take 3 capsules by mouth daily.      lisinopril (PRINIVIL,ZESTRIL) 10 MG tablet Take 10 mg by mouth every evening.      lisinopril (ZESTRIL) 10 MG tablet Take 1 tablet by mouth daily.  Multiple Minerals-Vitamins (CALCIUM-MAGNESIUM-ZINC-D3 PO) Take 6 tablets by mouth 2 (two) times daily.     Multiple Vitamins-Minerals (MULTIVITAMIN ADULTS 50+ PO) Take 1 tablet by mouth daily.      NONFORMULARY OR COMPOUNDED ITEM Inject 1 each as directed every 21 ( twenty-one) days. ALLERGEN IMMUNOTHERAPY     Probiotic Product (PROBIOTIC DAILY PO) Take 1 capsule by mouth daily.     amoxicillin-clavulanate (AUGMENTIN) 875-125 MG tablet Take 1 tablet by mouth 2 (two) times daily for 10 days. 20 tablet 0   No current facility-administered medications for this visit.    Allergies: No Known Allergies I reviewed his past medical history, social history, family history, and environmental history and no significant changes have been reported from his previous visit.  ROS: All others negative except as noted per HPI.   Objective: BP 134/70   Pulse (!) 57   Temp 97.6 F (36.4 C) (Temporal)   Resp 17   Wt 147 lb (66.7 kg)   SpO2 98%   BMI 22.03 kg/m  Body mass index is 22.03 kg/m. General Appearance:  Alert, cooperative, no distress, appears stated age  Head:  Normocephalic, without obvious abnormality, atraumatic  Eyes:  Conjunctiva clear, EOM's intact  Nose: Nares normal,  erythematous nasal mucosa with yellow rhinorrhea, no visible anterior polyps, and septum midline  Throat: Lips, tongue normal; teeth and gums normal, + cobblestoning  Neck: Supple, symmetrical  Lungs:   clear to auscultation bilaterally, Respirations unlabored, no coughing  Heart:  regular rate and rhythm and no murmur, Appears well perfused  Extremities: No edema  Skin: Skin color, texture, turgor normal, no rashes or lesions on visualized portions of skin   Neurologic: No gross deficits   Previous notes and tests were reviewed. The plan was reviewed with the patient/family, and all questions/concerned were addressed.  It was my pleasure to see Brian Curtis today and participate in his care. Please feel free to contact me with any questions or concerns.  Sincerely,  Roney Marion, MD  Allergy & Immunology  [facility] Office: [phone]

## 2022-08-22 ENCOUNTER — Ambulatory Visit (INDEPENDENT_AMBULATORY_CARE_PROVIDER_SITE_OTHER): Payer: Medicare Other

## 2022-08-22 DIAGNOSIS — J309 Allergic rhinitis, unspecified: Secondary | ICD-10-CM

## 2022-08-22 NOTE — Progress Notes (Signed)
VIAL EXP 08-23-23

## 2022-08-23 DIAGNOSIS — J3089 Other allergic rhinitis: Secondary | ICD-10-CM | POA: Diagnosis not present

## 2022-09-06 ENCOUNTER — Ambulatory Visit (INDEPENDENT_AMBULATORY_CARE_PROVIDER_SITE_OTHER): Payer: Medicare Other

## 2022-09-06 DIAGNOSIS — J309 Allergic rhinitis, unspecified: Secondary | ICD-10-CM

## 2022-09-20 ENCOUNTER — Ambulatory Visit (INDEPENDENT_AMBULATORY_CARE_PROVIDER_SITE_OTHER): Payer: Medicare Other

## 2022-09-20 DIAGNOSIS — J309 Allergic rhinitis, unspecified: Secondary | ICD-10-CM

## 2022-10-04 ENCOUNTER — Ambulatory Visit (INDEPENDENT_AMBULATORY_CARE_PROVIDER_SITE_OTHER): Payer: Medicare Other

## 2022-10-04 DIAGNOSIS — J309 Allergic rhinitis, unspecified: Secondary | ICD-10-CM | POA: Diagnosis not present

## 2022-10-17 ENCOUNTER — Ambulatory Visit (INDEPENDENT_AMBULATORY_CARE_PROVIDER_SITE_OTHER): Payer: Medicare Other

## 2022-10-17 DIAGNOSIS — J309 Allergic rhinitis, unspecified: Secondary | ICD-10-CM | POA: Diagnosis not present

## 2022-11-01 ENCOUNTER — Ambulatory Visit (INDEPENDENT_AMBULATORY_CARE_PROVIDER_SITE_OTHER): Payer: Medicare Other | Admitting: Family

## 2022-11-01 ENCOUNTER — Encounter: Payer: Self-pay | Admitting: Family

## 2022-11-01 VITALS — BP 132/66 | HR 58 | Temp 99.0°F | Resp 18

## 2022-11-01 DIAGNOSIS — J069 Acute upper respiratory infection, unspecified: Secondary | ICD-10-CM

## 2022-11-01 DIAGNOSIS — J302 Other seasonal allergic rhinitis: Secondary | ICD-10-CM

## 2022-11-01 DIAGNOSIS — J3489 Other specified disorders of nose and nasal sinuses: Secondary | ICD-10-CM

## 2022-11-01 DIAGNOSIS — J3089 Other allergic rhinitis: Secondary | ICD-10-CM | POA: Diagnosis not present

## 2022-11-01 MED ORDER — AMOXICILLIN-POT CLAVULANATE 875-125 MG PO TABS: 1.0000 | ORAL_TABLET | Freq: Two times a day (BID) | ORAL | 0 refills | Status: DC

## 2022-11-01 NOTE — Patient Instructions (Addendum)
1. Seasonal and perennial allergic rhinitis - Continue with allergy shots. If you need to start the antibiotic do not come to get allergy injections while on.  - Continue with the nasal saline rinses. ( Use the saline rinse prior to using Ryaltris nasal spray) - Start Ryaltris using 2 sprays in each nostril twice a day as needed for runny/stuffy nose (sample given). In the right nostril, point the applicator out toward the right ear. In the left nostril, point the applicator out toward the left ear - Your rapid Covid-19 test today was negative - I would not recommend starting an antibiotic at this time due to the short duration of symptoms. I will send in a prescription for Augmentin to have on hand should your symptoms still persist after 7 days of symptoms   Follow up: in 6 months or sooner if needed

## 2022-11-01 NOTE — Progress Notes (Unsigned)
[place] [address] Dept: [phone]  FOLLOW UP NOTE  Patient ID: Brian Curtis, male    DOB: 1946/10/15  Age: 76 y.o. MRN: [id number] Date of Office Visit: 11/01/2022  Assessment  Chief Complaint: Other ([place] visit, post nasal drainage, itchy scratchy throat, cough, runny nose and yellow mucus)  HPI Brian Curtis is a 76 year old male who presents today for an acute visit of possible sinus infection.  He was last seen on August 08, 2022 by Dr. Edison Pace for acute nonrecurrent maxillary sinusitis and seasonal and perennial allergic rhinitis.  He denies any new diagnosis or surgery since his last office visit.  He reports that on Sunday he got back from being at the [facility] and started having a scratchy throat.  He then had a little bit of drainage when he got home.  He does a saline rinse daily and it did not seem to help.  He increased it to 3 times a day.  He reports rhinorrhea that is yellow and this started yesterday.  He has left-sided nasal congestion and postnasal drip.  He feels like the postnasal drip is causing him to cough.  He denies fever, chills, body aches, or sick contact.  He is not currently using any nasal sprays or taking any over-the-counter antihistamines.  He is interested in starting an antibiotic.  He reports he went 7 years without having a sinus infection and this would be his third this year.   Drug Allergies:  No Known Allergies  Review of Systems: Review of Systems  Constitutional:  Negative for chills and fever.       Denies body aches  HENT:         Reports scratchy throat, left-sided nasal congestion, postnasal drip, and rhinorrhea that is yellow since yesterday  Eyes:        Denies itchy watery eyes  Respiratory:  Positive for cough. Negative for shortness of breath and wheezing.        Reports cough due to postnasal drip  Cardiovascular:  Negative for chest pain and palpitations.  Gastrointestinal:        Denies heartburn or  reflux symptoms  Genitourinary:  Negative for frequency.  Skin:  Negative for itching and rash.  Neurological:  Negative for headaches.  Endo/Heme/Allergies:  Positive for environmental allergies.     Physical Exam: BP 132/66   Pulse (!) 58   Temp 99 F (37.2 C) (Temporal)   Resp 18   SpO2 100%    Physical Exam Constitutional:      Appearance: Normal appearance.  HENT:     Head: Normocephalic and atraumatic.     Comments: Pharynx normal, eyes normal, ears normal, nose: Bilateral lower turbinates mildly edematous slightly erythematous with no drainage noted    Right Ear: Tympanic membrane, ear canal and external ear normal.     Left Ear: Tympanic membrane, ear canal and external ear normal.     Mouth/Throat:     Mouth: Mucous membranes are moist.     Pharynx: Oropharynx is clear.  Eyes:     Conjunctiva/sclera: Conjunctivae normal.  Cardiovascular:     Rate and Rhythm: Regular rhythm. Bradycardia present.     Heart sounds: Normal heart sounds.  Pulmonary:     Effort: Pulmonary effort is normal.     Breath sounds: Normal breath sounds.     Comments: Iungs clear to auscultation Musculoskeletal:     Cervical back: Neck supple.  Skin:  General: Skin is warm.  Neurological:     Mental Status: He is alert and oriented to person, place, and time.  Psychiatric:        Mood and Affect: Mood normal.        Behavior: Behavior normal.        Thought Content: Thought content normal.        Judgment: Judgment normal.     Diagnostics: Your rapid COVID-19 test today was negative  Assessment and Plan: 1. Viral upper respiratory tract infection   2. Seasonal and perennial allergic rhinitis   3. Stuffy and runny nose     Meds ordered this encounter  Medications   amoxicillin-clavulanate (AUGMENTIN) 875-125 MG tablet    Sig: Take 1 tablet by mouth 2 (two) times daily.    Dispense:  14 tablet    Refill:  0    Patient Instructions  1. Seasonal and perennial allergic  rhinitis - Continue with allergy shots. If you need to start the antibiotic do not come to get allergy injections while on.  - Continue with the nasal saline rinses. ( Use the saline rinse prior to using Ryaltris nasal spray) - Start Ryaltris using 2 sprays in each nostril twice a day as needed for runny/stuffy nose (sample given). In the right nostril, point the applicator out toward the right ear. In the left nostril, point the applicator out toward the left ear - Your rapid Covid-19 test today was negative - I would not recommend starting an antibiotic at this time due to the short duration of symptoms. I will send in a prescription for Augmentin to have on hand should your symptoms still persist after 7 days of symptoms   Follow up: in 6 months or sooner if needed       Return in about 6 months (around 05/03/2023), or if symptoms worsen or fail to improve.    Thank you for the opportunity to care for this patient.  Please do not hesitate to contact me with questions.  Althea Charon, FNP [facility]

## 2022-11-04 ENCOUNTER — Ambulatory Visit (INDEPENDENT_AMBULATORY_CARE_PROVIDER_SITE_OTHER): Payer: Medicare Other

## 2022-11-04 DIAGNOSIS — J309 Allergic rhinitis, unspecified: Secondary | ICD-10-CM

## 2022-11-09 ENCOUNTER — Ambulatory Visit (INDEPENDENT_AMBULATORY_CARE_PROVIDER_SITE_OTHER): Payer: Medicare Other

## 2022-11-09 DIAGNOSIS — J309 Allergic rhinitis, unspecified: Secondary | ICD-10-CM | POA: Diagnosis not present

## 2022-11-16 ENCOUNTER — Ambulatory Visit (INDEPENDENT_AMBULATORY_CARE_PROVIDER_SITE_OTHER): Payer: Medicare Other

## 2022-11-16 DIAGNOSIS — J309 Allergic rhinitis, unspecified: Secondary | ICD-10-CM

## 2022-11-23 ENCOUNTER — Ambulatory Visit (INDEPENDENT_AMBULATORY_CARE_PROVIDER_SITE_OTHER): Payer: Medicare Other | Admitting: *Deleted

## 2022-11-23 DIAGNOSIS — J309 Allergic rhinitis, unspecified: Secondary | ICD-10-CM | POA: Diagnosis not present

## 2022-12-08 ENCOUNTER — Ambulatory Visit (INDEPENDENT_AMBULATORY_CARE_PROVIDER_SITE_OTHER): Payer: Medicare Other

## 2022-12-08 DIAGNOSIS — J309 Allergic rhinitis, unspecified: Secondary | ICD-10-CM | POA: Diagnosis not present

## 2022-12-19 DIAGNOSIS — J3089 Other allergic rhinitis: Secondary | ICD-10-CM | POA: Diagnosis not present

## 2022-12-19 NOTE — Progress Notes (Signed)
VIAL EXP 12-20-23

## 2022-12-20 ENCOUNTER — Ambulatory Visit (INDEPENDENT_AMBULATORY_CARE_PROVIDER_SITE_OTHER): Payer: Medicare Other

## 2022-12-20 DIAGNOSIS — J309 Allergic rhinitis, unspecified: Secondary | ICD-10-CM | POA: Diagnosis not present

## 2023-01-03 ENCOUNTER — Ambulatory Visit (INDEPENDENT_AMBULATORY_CARE_PROVIDER_SITE_OTHER): Payer: Medicare Other

## 2023-01-03 DIAGNOSIS — J309 Allergic rhinitis, unspecified: Secondary | ICD-10-CM | POA: Diagnosis not present

## 2023-01-16 ENCOUNTER — Ambulatory Visit (INDEPENDENT_AMBULATORY_CARE_PROVIDER_SITE_OTHER): Payer: Medicare Other

## 2023-01-16 DIAGNOSIS — J309 Allergic rhinitis, unspecified: Secondary | ICD-10-CM

## 2023-01-31 ENCOUNTER — Ambulatory Visit (INDEPENDENT_AMBULATORY_CARE_PROVIDER_SITE_OTHER): Payer: Medicare Other

## 2023-01-31 DIAGNOSIS — J309 Allergic rhinitis, unspecified: Secondary | ICD-10-CM | POA: Diagnosis not present

## 2023-02-16 ENCOUNTER — Ambulatory Visit (INDEPENDENT_AMBULATORY_CARE_PROVIDER_SITE_OTHER): Payer: Medicare Other

## 2023-02-16 DIAGNOSIS — J309 Allergic rhinitis, unspecified: Secondary | ICD-10-CM

## 2023-02-27 ENCOUNTER — Ambulatory Visit (INDEPENDENT_AMBULATORY_CARE_PROVIDER_SITE_OTHER): Payer: Medicare Other

## 2023-02-27 DIAGNOSIS — J309 Allergic rhinitis, unspecified: Secondary | ICD-10-CM

## 2023-03-14 ENCOUNTER — Ambulatory Visit (INDEPENDENT_AMBULATORY_CARE_PROVIDER_SITE_OTHER): Payer: Medicare Other

## 2023-03-14 DIAGNOSIS — J309 Allergic rhinitis, unspecified: Secondary | ICD-10-CM

## 2023-03-27 ENCOUNTER — Ambulatory Visit (INDEPENDENT_AMBULATORY_CARE_PROVIDER_SITE_OTHER): Payer: Medicare Other

## 2023-03-27 DIAGNOSIS — J309 Allergic rhinitis, unspecified: Secondary | ICD-10-CM

## 2023-04-20 ENCOUNTER — Ambulatory Visit (INDEPENDENT_AMBULATORY_CARE_PROVIDER_SITE_OTHER): Payer: Medicare Other

## 2023-04-20 DIAGNOSIS — J309 Allergic rhinitis, unspecified: Secondary | ICD-10-CM | POA: Diagnosis not present

## 2023-05-04 ENCOUNTER — Ambulatory Visit (INDEPENDENT_AMBULATORY_CARE_PROVIDER_SITE_OTHER): Payer: Medicare Other

## 2023-05-04 DIAGNOSIS — J309 Allergic rhinitis, unspecified: Secondary | ICD-10-CM

## 2023-05-22 ENCOUNTER — Ambulatory Visit (INDEPENDENT_AMBULATORY_CARE_PROVIDER_SITE_OTHER): Payer: Medicare Other

## 2023-05-22 DIAGNOSIS — J309 Allergic rhinitis, unspecified: Secondary | ICD-10-CM

## 2023-06-06 ENCOUNTER — Ambulatory Visit: Payer: Self-pay

## 2023-06-06 DIAGNOSIS — J309 Allergic rhinitis, unspecified: Secondary | ICD-10-CM

## 2023-06-09 NOTE — Progress Notes (Signed)
EXP 06/11/24

## 2023-06-12 DIAGNOSIS — J3089 Other allergic rhinitis: Secondary | ICD-10-CM | POA: Diagnosis not present

## 2023-06-20 ENCOUNTER — Ambulatory Visit (INDEPENDENT_AMBULATORY_CARE_PROVIDER_SITE_OTHER): Payer: Medicare Other

## 2023-06-20 DIAGNOSIS — J309 Allergic rhinitis, unspecified: Secondary | ICD-10-CM | POA: Diagnosis not present

## 2023-07-04 ENCOUNTER — Ambulatory Visit (INDEPENDENT_AMBULATORY_CARE_PROVIDER_SITE_OTHER): Payer: Medicare Other

## 2023-07-04 DIAGNOSIS — J309 Allergic rhinitis, unspecified: Secondary | ICD-10-CM

## 2023-07-13 ENCOUNTER — Ambulatory Visit (INDEPENDENT_AMBULATORY_CARE_PROVIDER_SITE_OTHER): Payer: Medicare Other

## 2023-07-13 DIAGNOSIS — J309 Allergic rhinitis, unspecified: Secondary | ICD-10-CM

## 2023-08-01 ENCOUNTER — Ambulatory Visit (INDEPENDENT_AMBULATORY_CARE_PROVIDER_SITE_OTHER): Payer: Medicare Other

## 2023-08-01 DIAGNOSIS — J309 Allergic rhinitis, unspecified: Secondary | ICD-10-CM

## 2023-08-14 ENCOUNTER — Ambulatory Visit (INDEPENDENT_AMBULATORY_CARE_PROVIDER_SITE_OTHER): Payer: Medicare Other

## 2023-08-14 DIAGNOSIS — J309 Allergic rhinitis, unspecified: Secondary | ICD-10-CM | POA: Diagnosis not present

## 2023-09-01 ENCOUNTER — Ambulatory Visit (INDEPENDENT_AMBULATORY_CARE_PROVIDER_SITE_OTHER): Payer: Medicare Other

## 2023-09-01 DIAGNOSIS — J309 Allergic rhinitis, unspecified: Secondary | ICD-10-CM

## 2023-09-18 ENCOUNTER — Ambulatory Visit (INDEPENDENT_AMBULATORY_CARE_PROVIDER_SITE_OTHER): Payer: Medicare Other

## 2023-09-18 DIAGNOSIS — J309 Allergic rhinitis, unspecified: Secondary | ICD-10-CM

## 2023-10-04 ENCOUNTER — Ambulatory Visit (INDEPENDENT_AMBULATORY_CARE_PROVIDER_SITE_OTHER): Payer: Self-pay

## 2023-10-04 DIAGNOSIS — J309 Allergic rhinitis, unspecified: Secondary | ICD-10-CM | POA: Diagnosis not present

## 2023-10-10 DIAGNOSIS — J3089 Other allergic rhinitis: Secondary | ICD-10-CM | POA: Diagnosis not present

## 2023-10-10 NOTE — Progress Notes (Signed)
VIAL EXP 10-09-24

## 2023-10-20 ENCOUNTER — Telehealth: Payer: Self-pay | Admitting: Family

## 2023-10-20 MED ORDER — EPINEPHRINE 0.3 MG/0.3ML IJ SOAJ: 0.3000 mg | Freq: Once | INTRAMUSCULAR | 1 refills | Status: AC

## 2023-10-20 NOTE — Telephone Encounter (Signed)
Sent epipen to [facility]

## 2023-10-20 NOTE — Telephone Encounter (Signed)
Patient wife called and stated  he need a refill on medication epipen

## 2023-10-24 ENCOUNTER — Ambulatory Visit (INDEPENDENT_AMBULATORY_CARE_PROVIDER_SITE_OTHER): Payer: Medicare Other

## 2023-10-24 DIAGNOSIS — J309 Allergic rhinitis, unspecified: Secondary | ICD-10-CM

## 2023-11-09 ENCOUNTER — Ambulatory Visit (INDEPENDENT_AMBULATORY_CARE_PROVIDER_SITE_OTHER): Payer: Medicare Other

## 2023-11-09 DIAGNOSIS — J309 Allergic rhinitis, unspecified: Secondary | ICD-10-CM | POA: Diagnosis not present

## 2023-11-17 ENCOUNTER — Ambulatory Visit (INDEPENDENT_AMBULATORY_CARE_PROVIDER_SITE_OTHER): Payer: Medicare Other

## 2023-11-17 DIAGNOSIS — J309 Allergic rhinitis, unspecified: Secondary | ICD-10-CM | POA: Diagnosis not present

## 2023-12-05 ENCOUNTER — Ambulatory Visit (INDEPENDENT_AMBULATORY_CARE_PROVIDER_SITE_OTHER): Payer: Medicare Other

## 2023-12-05 DIAGNOSIS — J309 Allergic rhinitis, unspecified: Secondary | ICD-10-CM

## 2023-12-12 ENCOUNTER — Ambulatory Visit (INDEPENDENT_AMBULATORY_CARE_PROVIDER_SITE_OTHER): Payer: Medicare Other

## 2023-12-12 DIAGNOSIS — J309 Allergic rhinitis, unspecified: Secondary | ICD-10-CM

## 2023-12-19 ENCOUNTER — Ambulatory Visit (INDEPENDENT_AMBULATORY_CARE_PROVIDER_SITE_OTHER): Payer: Medicare Other

## 2023-12-19 DIAGNOSIS — J309 Allergic rhinitis, unspecified: Secondary | ICD-10-CM | POA: Diagnosis not present

## 2023-12-26 ENCOUNTER — Ambulatory Visit (INDEPENDENT_AMBULATORY_CARE_PROVIDER_SITE_OTHER): Payer: Self-pay

## 2023-12-26 DIAGNOSIS — J309 Allergic rhinitis, unspecified: Secondary | ICD-10-CM

## 2024-01-12 ENCOUNTER — Ambulatory Visit: Payer: Medicare Other | Admitting: Internal Medicine

## 2024-01-12 ENCOUNTER — Encounter: Payer: Self-pay | Admitting: Internal Medicine

## 2024-01-12 VITALS — BP 132/70 | HR 58 | Temp 97.0°F | Resp 18 | Ht 69.0 in | Wt 156.4 lb

## 2024-01-12 DIAGNOSIS — J3089 Other allergic rhinitis: Secondary | ICD-10-CM

## 2024-01-12 NOTE — Patient Instructions (Addendum)
1. Seasonal and perennial allergic rhinitis - Continue with allergy shots.  - Continue with the nasal saline rinses. ( Use the saline rinse prior to using Ryaltris nasal spray) - continue flonase using 1 spray in each nostril twice a day as needed for runny/stuffy nose (sample given). In the right nostril, point the applicator out toward the right ear. In the left nostril, point the applicator out toward the left ear  Follow up: in 12 months or sooner if needed

## 2024-01-12 NOTE — Progress Notes (Signed)
   FOLLOW UP Date of Service/Encounter:  01/12/24  Subjective:  Brian Curtis (DOB: 29-Sep-1946) is a 78 y.o. male who [facility] in re-evaluation of the following: allergic rhinitis on AIT History obtained from: chart review and patient and wife .  For Review, LV was on 11/01/22  with Nehemiah Settle, FNP seen for acute visit for possible sinus infection. . See below for summary of history and diagnostics.   Therapeutic plans/changes recommended: Started on Ryaltris.  COVID-19 testing was negative. ----------------------------------------------------- Pertinent History/Diagnostics:  Allergic Rhinitis:  Has been on allergy injections for many years.  Has been in the red vial since prior to 2016.  He receives 1 vial with grasses, trees, ragweed, mites and mold. --------------------------------------------------- Today presents for follow-up. He is doing well from an allergy standpoint.  He continues to receive his injections every 2 weeks which she feels has been the appropriate amount of time in between injections.  He has not required antibiotics in over a year for sinus infections.  He does use NeilMed sinus rinses daily and Flonase.  He is not taking other allergy medications at this time.   All medications reviewed by clinical staff and updated in chart. No new pertinent medical or surgical history except as noted in HPI.  ROS: All others negative except as noted per HPI.   Objective:  BP 132/70   Pulse (!) 58   Temp (!) 97 F (36.1 C) (Temporal)   Resp 18   Ht 5\' 9"  (1.753 m)   Wt 156 lb 6.4 oz (70.9 kg)   SpO2 98%   BMI 23.10 kg/m  Body mass index is 23.1 kg/m. Physical Exam: General Appearance:  Alert, cooperative, no distress, appears stated age  Head:  Normocephalic, without obvious abnormality, atraumatic  Eyes:  Conjunctiva clear, EOM's intact  Ears EACs normal bilaterally and normal TMs bilaterally  Nose: Nares normal,  normal mucosa and no visible anterior polyps  Throat: Lips, tongue normal; teeth and gums normal, normal posterior oropharynx  Neck: Supple, symmetrical  Lungs:   clear to auscultation bilaterally, Respirations unlabored, no coughing, significant chest wall abnormality due to scoliosis  Heart:  regular rate and rhythm and no murmur, Appears well perfused  Extremities: No edema  Skin: Skin color, texture, turgor normal and no rashes or lesions on visualized portions of skin  Neurologic: No gross deficits   Labs:  Lab Orders  No laboratory test(s) ordered today     Assessment/Plan   1. Seasonal and perennial allergic rhinitis - Continue with allergy shots.  - Continue with the nasal saline rinses. ( Use the saline rinse prior to using Ryaltris nasal spray) - continue flonase using 1 spray in each nostril twice a day as needed for runny/stuffy nose (sample given). In the right nostril, point the applicator out toward the right ear. In the left nostril, point the applicator out toward the left ear  Follow up: in 12 months or sooner if needed  Other: allergy injection given in clinic today  Tonny Bollman, MD  [facility]

## 2024-01-24 ENCOUNTER — Ambulatory Visit (INDEPENDENT_AMBULATORY_CARE_PROVIDER_SITE_OTHER): Payer: Self-pay

## 2024-01-24 DIAGNOSIS — J309 Allergic rhinitis, unspecified: Secondary | ICD-10-CM

## 2024-02-06 ENCOUNTER — Ambulatory Visit (INDEPENDENT_AMBULATORY_CARE_PROVIDER_SITE_OTHER): Payer: Self-pay

## 2024-02-06 DIAGNOSIS — J309 Allergic rhinitis, unspecified: Secondary | ICD-10-CM

## 2024-02-22 ENCOUNTER — Ambulatory Visit (INDEPENDENT_AMBULATORY_CARE_PROVIDER_SITE_OTHER): Payer: Self-pay

## 2024-02-22 DIAGNOSIS — J309 Allergic rhinitis, unspecified: Secondary | ICD-10-CM

## 2024-03-06 ENCOUNTER — Ambulatory Visit (INDEPENDENT_AMBULATORY_CARE_PROVIDER_SITE_OTHER): Payer: Self-pay

## 2024-03-06 DIAGNOSIS — J309 Allergic rhinitis, unspecified: Secondary | ICD-10-CM

## 2024-03-18 ENCOUNTER — Ambulatory Visit (INDEPENDENT_AMBULATORY_CARE_PROVIDER_SITE_OTHER): Payer: Self-pay

## 2024-03-18 DIAGNOSIS — J309 Allergic rhinitis, unspecified: Secondary | ICD-10-CM

## 2024-03-26 DIAGNOSIS — J3089 Other allergic rhinitis: Secondary | ICD-10-CM | POA: Diagnosis not present

## 2024-03-26 NOTE — Progress Notes (Signed)
 VIAL MADE 03-26-24. EXP 03-26-25

## 2024-04-02 ENCOUNTER — Ambulatory Visit (INDEPENDENT_AMBULATORY_CARE_PROVIDER_SITE_OTHER): Payer: Self-pay

## 2024-04-02 DIAGNOSIS — J309 Allergic rhinitis, unspecified: Secondary | ICD-10-CM

## 2024-04-17 ENCOUNTER — Ambulatory Visit (INDEPENDENT_AMBULATORY_CARE_PROVIDER_SITE_OTHER)

## 2024-04-17 DIAGNOSIS — J309 Allergic rhinitis, unspecified: Secondary | ICD-10-CM | POA: Diagnosis not present

## 2024-04-23 ENCOUNTER — Ambulatory Visit (INDEPENDENT_AMBULATORY_CARE_PROVIDER_SITE_OTHER): Payer: Self-pay

## 2024-04-23 DIAGNOSIS — J309 Allergic rhinitis, unspecified: Secondary | ICD-10-CM

## 2024-05-03 ENCOUNTER — Ambulatory Visit (INDEPENDENT_AMBULATORY_CARE_PROVIDER_SITE_OTHER): Payer: Self-pay

## 2024-05-03 DIAGNOSIS — J309 Allergic rhinitis, unspecified: Secondary | ICD-10-CM

## 2024-05-13 ENCOUNTER — Ambulatory Visit (INDEPENDENT_AMBULATORY_CARE_PROVIDER_SITE_OTHER): Payer: Self-pay

## 2024-05-13 DIAGNOSIS — J309 Allergic rhinitis, unspecified: Secondary | ICD-10-CM

## 2024-05-30 ENCOUNTER — Ambulatory Visit (INDEPENDENT_AMBULATORY_CARE_PROVIDER_SITE_OTHER)

## 2024-05-30 DIAGNOSIS — J309 Allergic rhinitis, unspecified: Secondary | ICD-10-CM | POA: Diagnosis not present

## 2024-06-06 NOTE — Progress Notes (Signed)
 VIALS TO BE MADE 06-10-24

## 2024-06-10 DIAGNOSIS — J3089 Other allergic rhinitis: Secondary | ICD-10-CM | POA: Diagnosis not present

## 2024-06-13 ENCOUNTER — Ambulatory Visit (INDEPENDENT_AMBULATORY_CARE_PROVIDER_SITE_OTHER): Payer: Self-pay

## 2024-06-13 DIAGNOSIS — J309 Allergic rhinitis, unspecified: Secondary | ICD-10-CM

## 2024-06-25 ENCOUNTER — Ambulatory Visit (INDEPENDENT_AMBULATORY_CARE_PROVIDER_SITE_OTHER): Payer: Self-pay

## 2024-06-25 DIAGNOSIS — J309 Allergic rhinitis, unspecified: Secondary | ICD-10-CM

## 2024-07-11 ENCOUNTER — Ambulatory Visit (INDEPENDENT_AMBULATORY_CARE_PROVIDER_SITE_OTHER)

## 2024-07-11 DIAGNOSIS — J309 Allergic rhinitis, unspecified: Secondary | ICD-10-CM

## 2024-07-24 ENCOUNTER — Ambulatory Visit (INDEPENDENT_AMBULATORY_CARE_PROVIDER_SITE_OTHER): Payer: Self-pay

## 2024-07-24 DIAGNOSIS — J309 Allergic rhinitis, unspecified: Secondary | ICD-10-CM

## 2024-08-13 ENCOUNTER — Ambulatory Visit (INDEPENDENT_AMBULATORY_CARE_PROVIDER_SITE_OTHER): Payer: Self-pay

## 2024-08-13 DIAGNOSIS — J309 Allergic rhinitis, unspecified: Secondary | ICD-10-CM

## 2024-08-28 ENCOUNTER — Other Ambulatory Visit: Payer: Self-pay

## 2024-08-28 MED ORDER — FLUTICASONE PROPIONATE 50 MCG/ACT NA SUSP: 1.0000 | Freq: Every day | NASAL | 5 refills | Status: AC

## 2024-08-28 NOTE — Telephone Encounter (Signed)
 Received mychart message from wife, Dagoberto, requesting refill for flonase to be sent to [facility] due to cost at local pharmacy. Sent refill to [facility] as requested .

## 2024-08-30 ENCOUNTER — Ambulatory Visit (INDEPENDENT_AMBULATORY_CARE_PROVIDER_SITE_OTHER)

## 2024-08-30 DIAGNOSIS — J309 Allergic rhinitis, unspecified: Secondary | ICD-10-CM

## 2024-09-05 ENCOUNTER — Ambulatory Visit (INDEPENDENT_AMBULATORY_CARE_PROVIDER_SITE_OTHER): Payer: Self-pay

## 2024-09-05 DIAGNOSIS — J309 Allergic rhinitis, unspecified: Secondary | ICD-10-CM | POA: Diagnosis not present

## 2024-09-19 ENCOUNTER — Ambulatory Visit (INDEPENDENT_AMBULATORY_CARE_PROVIDER_SITE_OTHER): Payer: Self-pay

## 2024-09-19 DIAGNOSIS — J309 Allergic rhinitis, unspecified: Secondary | ICD-10-CM

## 2024-10-02 ENCOUNTER — Ambulatory Visit (INDEPENDENT_AMBULATORY_CARE_PROVIDER_SITE_OTHER): Payer: Self-pay

## 2024-10-02 DIAGNOSIS — J309 Allergic rhinitis, unspecified: Secondary | ICD-10-CM | POA: Diagnosis not present

## 2024-10-21 ENCOUNTER — Ambulatory Visit (INDEPENDENT_AMBULATORY_CARE_PROVIDER_SITE_OTHER)

## 2024-10-21 DIAGNOSIS — J309 Allergic rhinitis, unspecified: Secondary | ICD-10-CM

## 2024-11-05 ENCOUNTER — Ambulatory Visit

## 2024-11-05 DIAGNOSIS — J302 Other seasonal allergic rhinitis: Secondary | ICD-10-CM | POA: Diagnosis not present

## 2024-11-05 DIAGNOSIS — J309 Allergic rhinitis, unspecified: Secondary | ICD-10-CM

## 2024-11-18 ENCOUNTER — Ambulatory Visit

## 2024-11-18 DIAGNOSIS — J309 Allergic rhinitis, unspecified: Secondary | ICD-10-CM

## 2024-12-04 ENCOUNTER — Other Ambulatory Visit: Payer: Self-pay

## 2024-12-04 ENCOUNTER — Encounter: Payer: Self-pay | Admitting: Physician Assistant

## 2024-12-04 ENCOUNTER — Ambulatory Visit: Admitting: Physician Assistant

## 2024-12-04 DIAGNOSIS — S8002XA Contusion of left knee, initial encounter: Secondary | ICD-10-CM | POA: Diagnosis not present

## 2024-12-04 DIAGNOSIS — M25062 Hemarthrosis, left knee: Secondary | ICD-10-CM | POA: Diagnosis not present

## 2024-12-04 DIAGNOSIS — M25562 Pain in left knee: Secondary | ICD-10-CM

## 2024-12-04 MED ORDER — METHYLPREDNISOLONE ACETATE 40 MG/ML IJ SUSP
40.0000 mg | INTRAMUSCULAR | Status: AC | PRN
  Administered 2024-12-04: 40 mg via INTRA_ARTICULAR

## 2024-12-04 MED ORDER — LIDOCAINE HCL (PF) 1 % IJ SOLN
5.0000 mL | INTRAMUSCULAR | Status: AC | PRN
  Administered 2024-12-04: 5 mL

## 2024-12-04 NOTE — Progress Notes (Signed)
 "  Office Visit Note   Patient: Brian Curtis           Date of Birth: [birth year]           MRN: [id number] Visit Date: 12/04/2024              Requested by: Naomi Hitch, MD [address] [place],  [place] [address] PCP: Naomi Hitch, MD  Chief Complaint  Patient presents with   Left Leg - Pain   Left Knee - Pain      HPI: 79 y/o male with left knee acute pain after falling down his stairs and missing the last one he landed on his left knee.  He has pain and swelling that is making it difficult to ambulate.  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 Vernetta.    Assessment & Plan: Visit Diagnoses:  1. Acute pain of left knee   2. Hemarthrosis, left knee   3. Contusion of left knee, initial encounter     Plan: Left knee contusion.  Blood collection in the knee joint after a contusion with hemarthrosis leading to significant pain, swelling,  stiffness, and restricted movement. The joint was aspirated and 50 cc of blood was drawn off the left knee joint.  He then was given a steroid knee injection.  He tolerated this well.     RICE (Rest, Ice, Compression, Elevation).  He will take ibuprofen alternating with tylenol  as needed for pain.    Follow-Up Instructions: No follow-ups on file.   Ortho Exam  Patient is alert, oriented, no adenopathy, well-dressed, normal affect, normal respiratory effort. Joint effusion with pain to palpation and active motion.  He has an antalgic gait due to the swelling and pain.  No cellulitis is noted.  Restricted flexion and extension.  No pain with left hip internal and external motion passively.   Palpable DP pulse.         Imaging: Left knee x ray shows soft tissue edema, no fractures.  He has B lateral joint line narrowing with evidence of OA.    Labs: No results found for: HGBA1C, ESRSEDRATE, CRP, LABURIC, REPTSTATUS, GRAMSTAIN, CULT,  LABORGA   Lab Results  Component Value Date   ALBUMIN 3.9 10/25/2010    No results found for: MG No results found for: VD25OH  No results found for: PREALBUMIN    Latest Ref Rng & Units 01/06/2018    5:45 AM 12/29/2017   10:02 AM 10/25/2010   10:45 AM  CBC EXTENDED  WBC 4.0 - 10.5 K/uL 6.9  3.5  3.0   RBC 4.22 - 5.81 MIL/uL 3.41  4.08  4.15   Hemoglobin 13.0 - 17.0 g/dL 89.1  87.1  86.3   HCT 39.0 - 52.0 % 31.0  37.4  39.8   Platelets 150 - 400 K/uL 153  185  153   NEUT# 1.7 - 7.7 K/uL   1.5   Lymph# 0.7 - 4.0 K/uL   1.2      There is no height or weight on file to calculate BMI.  Orders:  Orders Placed This Encounter  Procedures   Large Joint Inj   XR Knee 1-2 Views Left   No orders of the defined types were placed in this encounter.    Procedures: Large Joint Inj on 12/04/2024 4:34 PM Indications: pain and diagnostic evaluation Details: 22 G 1.5 in needle  Arthrogram: No  Medications: 5 mL lidocaine  (PF) 1 %; 40 mg methylPREDNISolone  acetate 40 MG/ML Outcome: tolerated well, no immediate complications Procedure, treatment alternatives, risks and benefits explained, specific risks discussed. Consent was given by the patient. Immediately prior to procedure a time out was called to verify the correct patient, procedure, equipment, support staff and site/side marked as required. Patient was prepped and draped in the usual sterile fashion.      Clinical Data: No additional findings.  ROS:  All other systems negative, except as noted in the HPI. Review of Systems  Objective: Vital Signs: There were no vitals taken for this visit.  Specialty Comments:  No specialty comments available.  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 I SEE HIM ONCE EVERY YEAR    Osteoporosis    debateable per pt.     Family History  Problem Relation Age of Onset   Colon cancer Father 76   Stroke Father    Colon polyps Father    Cancer Son        lympnodes    Allergic rhinitis Neg Hx    Angioedema Neg Hx    Asthma Neg Hx    Eczema Neg Hx    Immunodeficiency Neg Hx    Urticaria Neg Hx    Esophageal cancer Neg Hx    Rectal cancer Neg Hx    Stomach cancer Neg Hx     Past Surgical History:  Procedure Laterality Date   APPENDECTOMY  07/1983   COLONOSCOPY  11/2003   ESOPHAGOGASTRODUODENOSCOPY ENDOSCOPY  11/2003   HEMORROIDECTOMY  02/2006   with blood clot   HERNIA REPAIR  10/2010   double per pt.   KIDNEY STONE SURGERY  05/2001   REPLACEMENT TOTAL HIP W/  RESURFACING IMPLANTS Left 12/2017   TOTAL HIP ARTHROPLASTY Left 01/05/2018   Procedure: LEFT TOTAL HIP ARTHROPLASTY ANTERIOR APPROACH;  Surgeon: Vernetta Lonni GRADE, MD;  Location: [facility];  Service: Orthopedics;  Laterality: Left;   Social History   Occupational History   Not on file  Tobacco Use   Smoking status: Never   Smokeless tobacco: Never  Vaping Use   Vaping status: Never Used  Substance and Sexual Activity   Alcohol use: Yes    Alcohol/week: 1.0 standard drink of alcohol    Types: 1 Glasses of wine per week    Comment: rare socail   Drug use: No   Sexual activity: Yes       "

## 2024-12-05 ENCOUNTER — Ambulatory Visit (INDEPENDENT_AMBULATORY_CARE_PROVIDER_SITE_OTHER)

## 2024-12-05 DIAGNOSIS — J302 Other seasonal allergic rhinitis: Secondary | ICD-10-CM

## 2024-12-10 ENCOUNTER — Ambulatory Visit: Admitting: Physician Assistant

## 2024-12-10 ENCOUNTER — Encounter: Payer: Self-pay | Admitting: Physician Assistant

## 2024-12-10 DIAGNOSIS — M25062 Hemarthrosis, left knee: Secondary | ICD-10-CM

## 2024-12-10 NOTE — Progress Notes (Signed)
 "  Office Visit Note   Patient: Brian Curtis           Date of Birth: 1946/05/19           MRN: [id number] Visit Date: 12/10/2024              Requested by: Naomi Hitch, MD [address] [place],  [place] [address] PCP: Naomi Hitch, MD  Chief Complaint  Patient presents with   Left Knee - Follow-up      HPI: 79 y/o male with left knee acute pain after falling down his stairs and missing the last one he landed on his left knee.  He has pain and swelling that is making it difficult to ambulate.  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 Vernetta.    He is here today for follow up exam.  He states he is doing well.  His knee has resolved swelling and he is ambulating without a limp.  He denies fever and chills.    Assessment & Plan: Visit Diagnoses:  1. Hemarthrosis, left knee     Plan:  Knee hemarthrosis after a traumatic fall. Activity as tolerates He is back to baseline.    Follow-Up Instructions: Return if symptoms worsen or fail to improve.   Ortho Exam  Patient is alert, oriented, no adenopathy, well-dressed, normal affect, normal respiratory effort. Full ROM flexion and extension with palpable patellar crepitus.  Non tender to palpation medial and lateral joint lines.  No effusion or cellulitis.  He has palpable DP pulses.     Imaging: No results found. No images are attached to the encounter.  Labs: No results found for: HGBA1C, ESRSEDRATE, CRP, LABURIC, REPTSTATUS, GRAMSTAIN, CULT, LABORGA   Lab Results  Component Value Date   ALBUMIN 3.9 10/25/2010    No results found for: MG No results found for: VD25OH  No results found for: PREALBUMIN    Latest Ref Rng & Units 01/06/2018    5:45 AM 12/29/2017   10:02 AM 10/25/2010   10:45 AM  CBC EXTENDED  WBC 4.0 - 10.5 K/uL 6.9  3.5  3.0   RBC 4.22 - 5.81 MIL/uL 3.41  4.08  4.15   Hemoglobin 13.0 - 17.0 g/dL 89.1   87.1  86.3   HCT 39.0 - 52.0 % 31.0  37.4  39.8   Platelets 150 - 400 K/uL 153  185  153   NEUT# 1.7 - 7.7 K/uL   1.5   Lymph# 0.7 - 4.0 K/uL   1.2      There is no height or weight on file to calculate BMI.  Orders:  No orders of the defined types were placed in this encounter.  No orders of the defined types were placed in this encounter.    Procedures: No procedures performed  Clinical Data: No additional findings.  ROS:  All other systems negative, except as noted in the HPI. Review of Systems  Objective: Vital Signs: There were no vitals taken for this visit.  Specialty Comments:  No specialty comments available.  PMFS History: Patient Active Problem List   Diagnosis Date Noted   Status post total replacement of left hip 01/05/2018   Unilateral primary osteoarthritis, left hip 09/04/2017   DD (diverticular disease) 12/17/2015   Calculus of kidney 12/17/2015   Seasonal allergic conjunctivitis 12/17/2015   Viral upper respiratory tract infection 11/10/2015   Other allergic rhinitis 11/10/2015  Essential hypertension 11/10/2015   Allergic rhinitis 07/31/2015   BP (high blood pressure) 10/04/2011   Past Medical History:  Diagnosis Date   Allergy    allergy shots every 3 weeks   Hypertension    Lupus    MANAGED BY DR NORLEEN HALL; PER PATIENT HE SAY I AM IN REMISSION AND IT ONLY REALLY AFFECTED THE SKIN . I SEE HIM ONCE EVERY YEAR    Osteoporosis    debateable per pt.     Family History  Problem Relation Age of Onset   Colon cancer Father 94   Stroke Father    Colon polyps Father    Cancer Son        lympnodes    Allergic rhinitis Neg Hx    Angioedema Neg Hx    Asthma Neg Hx    Eczema Neg Hx    Immunodeficiency Neg Hx    Urticaria Neg Hx    Esophageal cancer Neg Hx    Rectal cancer Neg Hx    Stomach cancer Neg Hx     Past Surgical History:  Procedure Laterality Date   APPENDECTOMY  07/1983   COLONOSCOPY  11/2003   ESOPHAGOGASTRODUODENOSCOPY  ENDOSCOPY  11/2003   HEMORROIDECTOMY  02/2006   with blood clot   HERNIA REPAIR  10/2010   double per pt.   KIDNEY STONE SURGERY  05/2001   REPLACEMENT TOTAL HIP W/  RESURFACING IMPLANTS Left 12/2017   TOTAL HIP ARTHROPLASTY Left 01/05/2018   Procedure: LEFT TOTAL HIP ARTHROPLASTY ANTERIOR APPROACH;  Surgeon: Vernetta Lonni GRADE, MD;  Location: [facility];  Service: Orthopedics;  Laterality: Left;   Social History   Occupational History   Not on file  Tobacco Use   Smoking status: Never   Smokeless tobacco: Never  Vaping Use   Vaping status: Never Used  Substance and Sexual Activity   Alcohol use: Yes    Alcohol/week: 1.0 standard drink of alcohol    Types: 1 Glasses of wine per week    Comment: rare socail   Drug use: No   Sexual activity: Yes       "

## 2024-12-17 ENCOUNTER — Ambulatory Visit (INDEPENDENT_AMBULATORY_CARE_PROVIDER_SITE_OTHER)

## 2024-12-17 DIAGNOSIS — J302 Other seasonal allergic rhinitis: Secondary | ICD-10-CM | POA: Diagnosis not present

## 2024-12-18 NOTE — Progress Notes (Signed)
 VIAL MADE ON 12/18/24

## 2025-01-03 ENCOUNTER — Ambulatory Visit (INDEPENDENT_AMBULATORY_CARE_PROVIDER_SITE_OTHER)

## 2025-01-03 DIAGNOSIS — J302 Other seasonal allergic rhinitis: Secondary | ICD-10-CM
